# Patient Record
Sex: Female | Born: 1955
Health system: Southern US, Community
[De-identification: ages and names within clinical notes are randomized; demographics above are authoritative.]

## PROBLEM LIST (undated history)

## (undated) DIAGNOSIS — K439 Ventral hernia without obstruction or gangrene: Secondary | ICD-10-CM

## (undated) DIAGNOSIS — E079 Disorder of thyroid, unspecified: Secondary | ICD-10-CM

## (undated) DIAGNOSIS — I10 Essential (primary) hypertension: Secondary | ICD-10-CM

## (undated) DIAGNOSIS — G4730 Sleep apnea, unspecified: Secondary | ICD-10-CM

## (undated) DIAGNOSIS — Z972 Presence of dental prosthetic device (complete) (partial): Secondary | ICD-10-CM

## (undated) DIAGNOSIS — J439 Emphysema, unspecified: Secondary | ICD-10-CM

## (undated) DIAGNOSIS — M1990 Unspecified osteoarthritis, unspecified site: Secondary | ICD-10-CM

## (undated) HISTORY — PX: REPLACEMENT TOTAL KNEE: SUR1224

## (undated) HISTORY — PX: TONSILLECTOMY: SUR1361

## (undated) HISTORY — PX: ABDOMINAL HYSTERECTOMY: SHX81

---

## 2006-08-01 ENCOUNTER — Emergency Department: Payer: Self-pay

## 2006-08-02 ENCOUNTER — Other Ambulatory Visit: Payer: Self-pay

## 2007-09-30 ENCOUNTER — Emergency Department: Payer: Self-pay | Admitting: Emergency Medicine

## 2008-01-08 ENCOUNTER — Emergency Department: Payer: Self-pay | Admitting: Emergency Medicine

## 2008-06-18 ENCOUNTER — Emergency Department: Payer: Self-pay | Admitting: Emergency Medicine

## 2009-06-29 ENCOUNTER — Emergency Department: Payer: Self-pay | Admitting: Emergency Medicine

## 2012-01-29 ENCOUNTER — Emergency Department: Payer: Self-pay | Admitting: Emergency Medicine

## 2012-01-29 LAB — URINALYSIS, COMPLETE
Bacteria: NONE SEEN
Bilirubin,UR: NEGATIVE
Blood: NEGATIVE
Leukocyte Esterase: NEGATIVE
Nitrite: NEGATIVE
Ph: 6 (ref 4.5–8.0)
Protein: NEGATIVE
Squamous Epithelial: 1

## 2012-12-19 ENCOUNTER — Ambulatory Visit: Payer: Self-pay | Admitting: Physician Assistant

## 2013-04-09 ENCOUNTER — Emergency Department: Payer: Self-pay | Admitting: Emergency Medicine

## 2013-04-09 LAB — TROPONIN I

## 2013-04-09 LAB — CBC
HCT: 36.2 % (ref 35.0–47.0)
HGB: 12.2 g/dL (ref 12.0–16.0)
MCH: 29.1 pg (ref 26.0–34.0)
MCHC: 33.7 g/dL (ref 32.0–36.0)
MCV: 86 fL (ref 80–100)
Platelet: 274 10*3/uL (ref 150–440)
RBC: 4.19 10*6/uL (ref 3.80–5.20)
RDW: 14.9 % — ABNORMAL HIGH (ref 11.5–14.5)
WBC: 7.4 10*3/uL (ref 3.6–11.0)

## 2013-04-09 LAB — BASIC METABOLIC PANEL
Anion Gap: 3 — ABNORMAL LOW (ref 7–16)
BUN: 12 mg/dL (ref 7–18)
CHLORIDE: 107 mmol/L (ref 98–107)
CREATININE: 0.82 mg/dL (ref 0.60–1.30)
Calcium, Total: 8.8 mg/dL (ref 8.5–10.1)
Co2: 29 mmol/L (ref 21–32)
EGFR (Non-African Amer.): >60
GLUCOSE: 83 mg/dL (ref 65–99)
Osmolality: 276 (ref 275–301)
Potassium: 3.2 mmol/L — ABNORMAL LOW (ref 3.5–5.1)
Sodium: 139 mmol/L (ref 136–145)

## 2013-04-29 ENCOUNTER — Ambulatory Visit: Payer: Self-pay | Admitting: Cardiovascular Disease

## 2015-03-04 ENCOUNTER — Other Ambulatory Visit: Payer: Self-pay | Admitting: Physician Assistant

## 2015-03-04 DIAGNOSIS — M545 Low back pain: Secondary | ICD-10-CM

## 2015-03-16 ENCOUNTER — Ambulatory Visit
Admission: RE | Admit: 2015-03-16 | Discharge: 2015-03-16 | Disposition: A | Discharge status: Home / Self Care | Payer: Self-pay | Source: Ambulatory Visit | Attending: Physician Assistant | Admitting: Physician Assistant

## 2015-03-16 DIAGNOSIS — M545 Low back pain: Secondary | ICD-10-CM

## 2015-07-27 ENCOUNTER — Ambulatory Visit (HOSPITAL_COMMUNITY)
Payer: No Typology Code available for payment source | Attending: Obstetrics and Gynecology | Admitting: Physical Therapy

## 2015-07-27 DIAGNOSIS — M6281 Muscle weakness (generalized): Secondary | ICD-10-CM | POA: Insufficient documentation

## 2015-07-27 DIAGNOSIS — R293 Abnormal posture: Secondary | ICD-10-CM | POA: Insufficient documentation

## 2015-07-27 DIAGNOSIS — N3941 Urge incontinence: Secondary | ICD-10-CM | POA: Insufficient documentation

## 2015-07-27 NOTE — Therapy (Signed)
[facility] [address] [place], [place], [address] Phone: [phone]   Fax:  [phone]  Physical Therapy Evaluation  Patient Details  Name: Ruth SimonsLinda S Griffin MRN: [id number] Date of Birth: April 16, 1955 Referring Provider: Lanora ManisElizabeth Deans   Encounter Date: 07/27/2015      PT End of Session - 07/27/15 1204    Visit Number 1   Number of Visits 10   Date for PT Re-Evaluation 08/26/15   Authorization Type VA : therapist only    Authorization - Visit Number 1   Authorization - Number of Visits 10   PT Start Time 1120   PT Stop Time 0200   PT Time Calculation (min) 880 min   Activity Tolerance Patient tolerated treatment well      No past medical history on file.  No past surgical history on file.  There were no vitals filed for this visit.       Subjective Assessment - 07/27/15 1121    Subjective Ruth Griffin states that she has times when she is not able to hold her urine therefore she has been referred to physical therapy   Pertinent History OA, ambulates with a cane    Patient Stated Goals To be able to make it to the restroom without wetting herself    Currently in Pain? Yes   Pain Score 4    Pain Location Shoulder   Pain Orientation Left   Pain Descriptors / Indicators Aching   Pain Radiating Towards We are not seeing the patient  for this.     Pain Onset More than a month ago   Pain Frequency Intermittent   Pain Score 6   Pain Location Back   Pain Orientation Lower;Right   Pain Descriptors / Indicators Aching   Pain Type Chronic pain   Pain Radiating Towards into right hip;    Aggravating Factors  sitting long period of time and weight bearing    Pain Relieving Factors lie down             [facility] Assessment - 07/27/15 0001    Assessment   Medical Diagnosis urge incontinence    Referring Provider Pernell DupreJohn [place]    Onset Date/Surgical Date 07/26/13   Prior Therapy not for this problem     Precautions   Precautions None   Restrictions   Weight Bearing Restrictions No   Balance Screen   Has the patient fallen in the past 6 months No   Has the patient had a decrease in activity level because of a fear of falling?  No   Is the patient reluctant to leave their home because of a fear of falling?  No   Home [employer]   Living Environment Private residence   Prior Function   Level of Independence Independent   Cognition   Overall Cognitive Status Within Functional Limits for tasks assessed   ROM / Strength   AROM / PROM / Strength Strength   Strength   Strength Assessment Site Other (comment)                 Pelvic Floor Special Questions - 07/27/15 0001    Are you Pregnant or attempting pregnancy? No   Prior Pregnancies Yes   Number of Pregnancies 5   Number of Vaginal Deliveries 5   Any difficulty with labor and deliveries No   Episiotomy Performed No   Currently Sexually Active Yes   Urinary Leakage Yes   How often --  3-4 times a day    Pad use 2 per day    Activities that cause leaking With strong urge   Urinary urgency Yes   Urinary frequency 3-4 times a day    Caffeine beverages --  coffee 1 8 oz a day; tea    Incontinence Goals Patient will be independent with completion of home exercise program for pelvic floor exercise, progressing to standing for all ADL's/activities in 4 weeks.;Patient will not require more than 1 pad for protection against leaking in 12 weeks.;Patient will be independent with self-management techniques including: pelvic floor exercise, urge control techniques, behavioral modifications (reducing caffeine intake) and bladder retraining in 12 weeks;Patient will not have to urinate greater than 1x per night in 12 weeks.          [facility] Adult PT Treatment/Exercise - 07/27/15 0001    Exercises   Exercises Lumbar   Lumbar Exercises: Supine   Ab Set 10 reps   Other Supine Lumbar Exercises Kegals quick flick 2 second hold; 4  second rest x 10    Ab set while standing at wall x 10             PT Education - 07/27/15 1203    Education provided Yes   Education Details correct way to complete kegals, the importance of strong abdominal mm for pelvic health.    Person(s) Educated Patient   Methods Explanation;Handout;Verbal cues   Comprehension Verbalized understanding;Returned demonstration          PT Short Term Goals - 07/27/15 1212    PT SHORT TERM GOAL #1   Title Pt to be independent in HEP to allow leakage only 2 times a day    Time 2   Period Weeks   Status New   PT SHORT TERM GOAL #2   Title Pt to be only having to use one pad a day to prevent soiling of clothes   Time 2   Period Weeks   Status New   PT SHORT TERM GOAL #3   Title Pt to verbalize the importance of voiding on a regular basis instead of holding her urine    Time 2   Period Weeks   Status New           PT Long Term Goals - 07/27/15 1214    PT LONG TERM GOAL #1   Title Pt to state that she has not leaked in the past week    Time 5   Period Weeks   Status New   PT LONG TERM GOAL #2   Title Pt to state that she is not using any pads  to keep from soiling her clothes    Time 5   Period Weeks   Status New   PT LONG TERM GOAL #3   Title Pt to verbalize the imporance of keeping up an advance HEP in order to prevent future incontinence problems    Time 5   Period Weeks   Status New               Plan - 07/27/15 1204    Clinical Impression Statement Ruth Griffin is a 60 yo female who has noted increased incontinence in the past two years.  She denies having difficulty with lifting or sneezing but rather states that she tends to leak when she is heading to the bathroom and thinking about going to the bathroom.  Thus the patient has been diagnosed with urge vs. stress incontinence.  She has been referred to skilled physical therapy for strengthening of her abdominal and pelvic area to decrease her incontinence.      Rehab Potential Good   PT Frequency 2x / week   PT Duration --  5 weeks    PT Treatment/Interventions ADLs/Self Care Home Management;Therapeutic activities;Therapeutic exercise;Patient/family education   PT Next Visit Plan stand at wall complete abdominal sets; hands and knees abdominal set; supine abdominal set, heelslides with abdominal sets. progress to heel slide with leg 1" off the ground, sidelying abduction with abdominal set, quadriped single leg lift with abdominal contraction, opposite arm/leg raise; progress to T band exercises    PT Home Exercise Plan given    Consulted and Agree with Plan of Care Patient      Patient will benefit from skilled therapeutic intervention in order to improve the following deficits and impairments:  Decreased strength, Postural dysfunction, Obesity, Impaired tone  Visit Diagnosis: Urge incontinence - Plan: PT plan of care cert/re-cert  Abnormal posture - Plan: PT plan of care cert/re-cert  Muscle weakness (generalized) - Plan: PT plan of care cert/re-cert      G-Codes - 08/07/2015 Jun 09, 1216    Functional Assessment Tool Used clinical judgement:  number of leakages a day    Functional Limitation Other PT primary   Other PT Primary Current Status (W0981) At least 40 percent but less than 60 percent impaired, limited or restricted   Other PT Primary Goal Status (X9147) At least 1 percent but less than 20 percent impaired, limited or restricted       Problem List There are no active problems to display for this patient.   RUSSELL,CINDY 08-07-2015, 12:24 PM  [place] [facility] [address] [place], [place], [address] Phone: [phone]   Fax:  [phone]  Name: Ruth Griffin MRN: [id number] Date of Birth: Feb 20, 1955

## 2015-07-28 ENCOUNTER — Ambulatory Visit (HOSPITAL_COMMUNITY): Payer: No Typology Code available for payment source

## 2015-07-28 DIAGNOSIS — M6281 Muscle weakness (generalized): Secondary | ICD-10-CM

## 2015-07-28 DIAGNOSIS — N3941 Urge incontinence: Secondary | ICD-10-CM | POA: Diagnosis not present

## 2015-07-28 DIAGNOSIS — R293 Abnormal posture: Secondary | ICD-10-CM

## 2015-07-28 NOTE — Therapy (Signed)
[facility] [address] [place], [place], [address] Phone: [phone]   Fax:  [phone]  Physical Therapy Treatment  Patient Details  Name: Ruth SimonsLinda S Griffin MRN: [id number] Date of Birth: 02/01/1955 Referring Provider: Lanora ManisElizabeth Deans   Encounter Date: 07/28/2015      PT End of Session - 07/28/15 1828    Visit Number 2   Number of Visits 10   Date for PT Re-Evaluation 08/26/15   Authorization Type VA : therapist only    Authorization - Visit Number 2   Authorization - Number of Visits 10   PT Start Time 1730   PT Stop Time 1808   PT Time Calculation (min) 38 min   Activity Tolerance Patient tolerated treatment well   Behavior During Therapy [facility] for tasks assessed/performed      No past medical history on file.  No past surgical history on file.  There were no vitals filed for this visit.      Subjective Assessment - 07/28/15 1737    Subjective Pt reports complaince with HEP.  Reports increase LE pain with weight bearing, no pain when sitting, pain scale 3/10 Lt shoulder today.     Pertinent History OA, ambulates with a cane    Patient Stated Goals To be able to make it to the restroom without wetting herself    Currently in Pain? Yes   Pain Score 3    Pain Location Shoulder   Pain Orientation Left   Pain Descriptors / Indicators Aching   Pain Type Chronic pain   Pain Radiating Towards We are not seeing the patient for this   Pain Onset More than a month ago   Pain Frequency Intermittent   Aggravating Factors  movement   Pain Relieving Factors pain meds, heat   Effect of Pain on Daily Activities deal with it              [facility] Adult PT Treatment/Exercise - 07/28/15 0001    Lumbar Exercises: Standing   Other Standing Lumbar Exercises standing at wall with ab sets   Lumbar Exercises: Supine   Ab Set 10 reps;5 seconds  2 sets   Heel Slides 10 reps  with ab sets   Other Supine Lumbar Exercises Kegals  quick flick 2 second hold; 4 second rest x 10   Other Supine Lumbar Exercises hands on knees with ab set                PT Education - 07/27/15 1203    Education provided Yes   Education Details correct way to complete kegals, the importance of strong abdominal mm for pelvic health.    Person(s) Educated Patient   Methods Explanation;Handout;Verbal cues   Comprehension Verbalized understanding;Returned demonstration          PT Short Term Goals - 07/27/15 1212    PT SHORT TERM GOAL #1   Title Pt to be independent in HEP to allow leakage only 2 times a day    Time 2   Period Weeks   Status New   PT SHORT TERM GOAL #2   Title Pt to be only having to use one pad a day to prevent soiling of clothes   Time 2   Period Weeks   Status New   PT SHORT TERM GOAL #3   Title Pt to verbalize the importance of voiding on a regular basis instead of holding her urine    Time 2  Period Weeks   Status New           PT Long Term Goals - 07/27/15 1214    PT LONG TERM GOAL #1   Title Pt to state that she has not leaked in the past week    Time 5   Period Weeks   Status New   PT LONG TERM GOAL #2   Title Pt to state that she is not using any pads  to keep from soiling her clothes    Time 5   Period Weeks   Status New   PT LONG TERM GOAL #3   Title Pt to verbalize the imporance of keeping up an advance HEP in order to prevent future incontinence problems    Time 5   Period Weeks   Status New               Plan - 07/28/15 1828    Clinical Impression Statement Reviewed goals, compliance with HEP and copy of eval given to pt.  Pt reports she has had no leaking today.  Session focus on improving core strengthening to assist with decreased incontinence.  Pt required multimodal tactile, verbal and demonstration to improve core activation with noted abdominal fatigue with therex.     Rehab Potential Good   PT Frequency 2x / week   PT Duration --  5 weeks   PT  Treatment/Interventions ADLs/Self Care Home Management;Therapeutic activities;Therapeutic exercise;Patient/family education   PT Next Visit Plan stand at wall complete abdominal sets; hands and knees abdominal set; supine abdominal set, heelslides with abdominal sets. progress to heel slide with leg 1" off the ground, sidelying abduction with abdominal set, quadriped single leg lift with abdominal contraction, opposite arm/leg raise; progress to T band exercises       Patient will benefit from skilled therapeutic intervention in order to improve the following deficits and impairments:  Decreased strength, Postural dysfunction, Obesity, Impaired tone  Visit Diagnosis: Urge incontinence  Abnormal posture  Muscle weakness (generalized)   Problem List There are no active problems to display for this patient.  [address]Demosthenes Virnig, LPTA; CBIS [phone]   Juel BurrowCockerham, Yonathan Perrow Jo 07/28/2015, 6:32 PM  [facility] [address] , [place], [address] Phone: [phone]   Fax:  [phone]  Name: Ruth SimonsLinda S Griffin MRN: [id number] Date of Birth: [birth year]

## 2015-08-03 ENCOUNTER — Ambulatory Visit (HOSPITAL_COMMUNITY)
Payer: No Typology Code available for payment source | Attending: Obstetrics and Gynecology | Admitting: Physical Therapy

## 2015-08-03 DIAGNOSIS — M6281 Muscle weakness (generalized): Secondary | ICD-10-CM | POA: Diagnosis present

## 2015-08-03 DIAGNOSIS — R293 Abnormal posture: Secondary | ICD-10-CM | POA: Insufficient documentation

## 2015-08-03 DIAGNOSIS — N3941 Urge incontinence: Secondary | ICD-10-CM | POA: Insufficient documentation

## 2015-08-03 NOTE — Therapy (Addendum)
[facility] [address] [place], [place], [address] Phone: [phone]   Fax:  [phone]  Physical Therapy Treatment  Patient Details  Name: Ruth SimonsLinda S Griffin MRN: [id number] Date of Birth: [birth year] Referring Provider: Lanora ManisElizabeth Deans   Encounter Date: 08/03/2015      PT End of Session - 08/03/15 1519    Visit Number 3   Number of Visits 10   Date for PT Re-Evaluation 08/26/15   Authorization Type VA : therapist only    Authorization - Visit Number 3   Authorization - Number of Visits 10   PT Start Time 1440  pt arrived late   PT Stop Time 1515   PT Time Calculation (min) 35 min   Activity Tolerance Patient tolerated treatment well   Behavior During Therapy [facility] for tasks assessed/performed      No past medical history on file.  No past surgical history on file.  There were no vitals filed for this visit.      Subjective Assessment - 08/03/15 1441    Subjective Pt states she has been doing her kegels at home but is not able to do the one with her back flat agains the wall. No other complaints right now.   Pertinent History OA, ambulates with a cane    Patient Stated Goals To be able to make it to the restroom without wetting herself    Currently in Pain? No/denies   Pain Onset More than a month ago                         [facility] Adult PT Treatment/Exercise - 08/03/15 0001    Exercises   Exercises Other Exercises   Other Exercises  Seated ab set with kegel, x10 reps, feet supported   Lumbar Exercises: Supine   Ab Set 15 reps;5 seconds   Bent Knee Raise 10 reps  each LE   Bent Knee Raise Limitations with abs set and kegel   Lumbar Exercises: Sidelying   Clam 10 reps   Clam Limitations x2 sets with red TB                PT Education - 08/03/15 1517    Education provided Yes   Education Details importance of correct techinque with therex to address abdominal strength and endurance;  benefit of increased hip strength to support back during activity   Person(s) Educated Patient   Methods Explanation;Demonstration;Handout   Comprehension Verbalized understanding;Returned demonstration          PT Short Term Goals - 07/27/15 1212    PT SHORT TERM GOAL #1   Title Pt to be independent in HEP to allow leakage only 2 times a day    Time 2   Period Weeks   Status New   PT SHORT TERM GOAL #2   Title Pt to be only having to use one pad a day to prevent soiling of clothes   Time 2   Period Weeks   Status New   PT SHORT TERM GOAL #3   Title Pt to verbalize the importance of voiding on a regular basis instead of holding her urine    Time 2   Period Weeks   Status New           PT Long Term Goals - 07/27/15 1214    PT LONG TERM GOAL #1   Title Pt to state that she has not leaked in  the past week    Time 5   Period Weeks   Status New   PT LONG TERM GOAL #2   Title Pt to state that she is not using any pads  to keep from soiling her clothes    Time 5   Period Weeks   Status New   PT LONG TERM GOAL #3   Title Pt to verbalize the imporance of keeping up an advance HEP in order to prevent future incontinence problems    Time 5   Period Weeks   Status New               Plan - 08/03/15 1519    Clinical Impression Statement Today's session focused on progression of deep abdominal activation with pt able to return correct demonstration in supine position. Addition of hip strengthening activity with good tolerance to reps and resistance without report of increased pain/fatigue. Provided a few exercises to HEP in addition to those provided initially, with pt able to return correct demonstration of technique.   Rehab Potential Good   PT Frequency 2x / week   PT Duration --  5 weeks   PT Treatment/Interventions ADLs/Self Care Home Management;Therapeutic activities;Therapeutic exercise;Patient/family education   PT Next Visit Plan stand at wall complete  abdominal sets; hands and knees abdominal set; supine abdominal set, heelslides with abdominal sets. progress to heel slide with leg 1" off the ground, sidelying abduction with abdominal set, quadriped single leg lift with abdominal contraction, opposite arm/leg raise; progress to T band exercises    PT Home Exercise Plan addition of clamshells (red TB), ab set wiht bent knee raise, and bridge with ab set/kegel   Consulted and Agree with Plan of Care Patient      Patient will benefit from skilled therapeutic intervention in order to improve the following deficits and impairments:  Decreased strength, Postural dysfunction, Obesity, Impaired tone  Visit Diagnosis: Urge incontinence  Abnormal posture  Muscle weakness (generalized)     Problem List There are no active problems to display for this patient.   3:24 PM,08/03/2015 Marylyn IshiharaSara Kiser PT, [name] [facility] [phone]  [facility] [address] [place], [place], [address] Phone: [phone]   Fax:  [phone]  Name: Ruth SimonsLinda S Griffin MRN: [id number] Date of Birth: Jul 31, 1955    *Addendum made to treatment   3:39 PM,08/03/2015 Marylyn IshiharaSara Kiser PT, [name] [facility] [phone]

## 2015-08-10 ENCOUNTER — Ambulatory Visit (HOSPITAL_COMMUNITY): Payer: No Typology Code available for payment source

## 2015-08-10 DIAGNOSIS — N3941 Urge incontinence: Secondary | ICD-10-CM

## 2015-08-10 DIAGNOSIS — M6281 Muscle weakness (generalized): Secondary | ICD-10-CM

## 2015-08-10 DIAGNOSIS — R293 Abnormal posture: Secondary | ICD-10-CM

## 2015-08-10 NOTE — Therapy (Signed)
[facility] [address] [place], [place], [address] Phone: [phone]   Fax:  [phone]  Physical Therapy Treatment  Patient Details  Name: Ruth Griffin MRN: [id number] Date of Birth: 1955/11/29 Referring Provider: Lanora Manis Deans   Encounter Date: 08/10/2015      PT End of Session - 08/10/15 1127    Visit Number 4   Number of Visits 10   Date for PT Re-Evaluation 08/26/15   Authorization Type VA : therapist only    Authorization - Visit Number 4   Authorization - Number of Visits 10   PT Start Time 1124   PT Stop Time 1202   PT Time Calculation (min) 38 min   Activity Tolerance Patient tolerated treatment well   Behavior During Therapy [facility] for tasks assessed/performed      No past medical history on file.  No past surgical history on file.  There were no vitals filed for this visit.      Subjective Assessment - 08/10/15 1126    Subjective Pt stated she continues to have her episodes of incontinuous but reports reduction to 2x a day.  No reports of pain today   Patient Stated Goals To be able to make it to the restroom without wetting herself    Currently in Pain? No/denies            [facility] Adult PT Treatment/Exercise - 08/10/15 0001    Lumbar Exercises: Standing   Other Standing Lumbar Exercises standing at wall with ab sets   Lumbar Exercises: Supine   Ab Set 15 reps;5 seconds   Heel Slides 10 reps   Heel Slides Limitations witih ab sets 1in off mat   Bent Knee Raise 10 reps   Bent Knee Raise Limitations with abs set and kegel for 2 sec holds   Other Supine Lumbar Exercises Kegals quick flick 2 second hold; 4 second rest x 10   Lumbar Exercises: Sidelying   Clam 15 reps   Clam Limitations with RTB             PT Short Term Goals - 07/27/15 1212    PT SHORT TERM GOAL #1   Title Pt to be independent in HEP to allow leakage only 2 times a day    Time 2   Period Weeks   Status New   PT SHORT  TERM GOAL #2   Title Pt to be only having to use one pad a day to prevent soiling of clothes   Time 2   Period Weeks   Status New   PT SHORT TERM GOAL #3   Title Pt to verbalize the importance of voiding on a regular basis instead of holding her urine    Time 2   Period Weeks   Status New           PT Long Term Goals - 07/27/15 1214    PT LONG TERM GOAL #1   Title Pt to state that she has not leaked in the past week    Time 5   Period Weeks   Status New   PT LONG TERM GOAL #2   Title Pt to state that she is not using any pads  to keep from soiling her clothes    Time 5   Period Weeks   Status New   PT LONG TERM GOAL #3   Title Pt to verbalize the imporance of keeping up an advance HEP in order to  prevent future incontinence problems    Time 5   Period Weeks   Status New               Plan - 08/10/15 1205    Clinical Impression Statement Session focus on progessing abdominal strengthening.  Added heel slides with kegal to improve eccentric control of core musculature with minimal tactile and verbal cueing to improve contraction with movement.  Pt with good tolerance to new exercises with no reports of increased pain.  Increased cueing required wth standing core activation exercise   Rehab Potential Good   PT Frequency 2x / week   PT Duration --  5 weeks   PT Treatment/Interventions ADLs/Self Care Home Management;Therapeutic activities;Therapeutic exercise;Patient/family education   PT Next Visit Plan stand at wall complete abdominal sets; hands and knees abdominal set; supine abdominal set, heelslides with abdominal sets. progress to heel slide with leg 1" off the ground, sidelying abduction with abdominal set, quadriped single leg lift with abdominal contraction, opposite arm/leg raise; progress to T band exercises       Patient will benefit from skilled therapeutic intervention in order to improve the following deficits and impairments:  Decreased strength,  Postural dysfunction, Obesity, Impaired tone  Visit Diagnosis: Urge incontinence  Abnormal posture  Muscle weakness (generalized)     Problem List There are no active problems to display for this patient.   Juel BurrowCockerham, Raedyn Wenke Jo 08/10/2015, 12:09 PM  [facility] [address] [place], [place], [address] Phone: [phone]   Fax:  [phone]  Name: Alger SimonsLinda S Williamson MRN: [id number] Date of Birth: 02-Mar-1955

## 2015-08-12 ENCOUNTER — Ambulatory Visit (HOSPITAL_COMMUNITY): Payer: No Typology Code available for payment source | Admitting: Physical Therapy

## 2015-08-12 ENCOUNTER — Telehealth (HOSPITAL_COMMUNITY): Payer: Self-pay | Admitting: Physical Therapy

## 2015-08-12 DIAGNOSIS — M6281 Muscle weakness (generalized): Secondary | ICD-10-CM

## 2015-08-12 DIAGNOSIS — R293 Abnormal posture: Secondary | ICD-10-CM

## 2015-08-12 DIAGNOSIS — N3941 Urge incontinence: Secondary | ICD-10-CM | POA: Diagnosis not present

## 2015-08-12 NOTE — Therapy (Signed)
[facility] [address] [place], [place], [address] Phone: [phone]   Fax:  [phone]  Physical Therapy Treatment  Patient Details  Name: Ruth Griffin MRN: [id number] Date of Birth: Jun 25, 1955 Referring Provider: Lanora Manis Deans   Encounter Date: 08/12/2015      PT End of Session - 08/12/15 1836    Visit Number 5   Number of Visits 10   Date for PT Re-Evaluation 08/26/15   Authorization Type VA : therapist only    Authorization - Visit Number 5   Authorization - Number of Visits 10   PT Start Time 1732   PT Stop Time 1813   PT Time Calculation (min) 41 min   Activity Tolerance Patient tolerated treatment well   Behavior During Therapy [facility] for tasks assessed/performed      No past medical history on file.  No past surgical history on file.  There were no vitals filed for this visit.      Subjective Assessment - 08/12/15 1735    Subjective Pt reports she is doing good. She saw her referring physician and feels she is making some improvement. Not sure if she is doing her HEP correctly or not.    Patient Stated Goals To be able to make it to the restroom without wetting herself    Currently in Pain? No/denies                         [facility] Adult PT Treatment/Exercise - 08/12/15 0001    Exercises   Other Exercises  seated Kegals quick flick 2 second hold; 4 second rest x 15; seated trunk derotation and ab set with green TB and UE punches x15 each side   Lumbar Exercises: Standing   Other Standing Lumbar Exercises standing at wall with ab sets 10x5 sec holds   Lumbar Exercises: Supine   Other Supine Lumbar Exercises --   Lumbar Exercises: Sidelying   Clam 10 reps   Clam Limitations green TB x2 sets each                PT Education - 08/12/15 1834    Education provided Yes   Education Details reviewed correct technique with wall ab set; encouraged pt to try counting backwards from 100,  etc. to occupy her mind when she has urges and is walking to the bathroom   Person(s) Educated Patient   Methods Explanation;Demonstration   Comprehension Verbalized understanding;Returned demonstration          PT Short Term Goals - 07/27/15 1212    PT SHORT TERM GOAL #1   Title Pt to be independent in HEP to allow leakage only 2 times a day    Time 2   Period Weeks   Status New   PT SHORT TERM GOAL #2   Title Pt to be only having to use one pad a day to prevent soiling of clothes   Time 2   Period Weeks   Status New   PT SHORT TERM GOAL #3   Title Pt to verbalize the importance of voiding on a regular basis instead of holding her urine    Time 2   Period Weeks   Status New           PT Long Term Goals - 07/27/15 1214    PT LONG TERM GOAL #1   Title Pt to state that she has not leaked in the past week  Time 5   Period Weeks   Status New   PT LONG TERM GOAL #2   Title Pt to state that she is not using any pads  to keep from soiling her clothes    Time 5   Period Weeks   Status New   PT LONG TERM GOAL #3   Title Pt to verbalize the imporance of keeping up an advance HEP in order to prevent future incontinence problems    Time 5   Period Weeks   Status New               Plan - 08/12/15 1836    Clinical Impression Statement Today's session continued to focus on core strength with pt able to perform in upright positions with minimal cues for correct technique. Encouraged continued HEP adherence and discussed ways to address sudden urges when walking in the door of her home. Will continue with current POC.    Rehab Potential Good   PT Frequency 2x / week   PT Duration --  5 weeks   PT Treatment/Interventions ADLs/Self Care Home Management;Therapeutic activities;Therapeutic exercise;Patient/family education   PT Next Visit Plan stand at wall complete abdominal sets; hands and knees abdominal set; supine abdominal set, heelslides with abdominal sets.  progress to heel slide with leg 1" off the ground, sidelying abduction with abdominal set, quadriped single leg lift with abdominal contraction, opposite arm/leg raise; progress to T band exercises    PT Home Exercise Plan no updates   Consulted and Agree with Plan of Care Patient      Patient will benefit from skilled therapeutic intervention in order to improve the following deficits and impairments:  Decreased strength, Postural dysfunction, Obesity, Impaired tone  Visit Diagnosis: Urge incontinence  Abnormal posture  Muscle weakness (generalized)     Problem List There are no active problems to display for this patient.   6:45 PM,08/12/2015 Marylyn IshiharaSara Kiser PT, [name] [facility] [phone]  [facility] [address] [place], [place], [address] Phone: [phone]   Fax:  [phone]  Name: Ruth SimonsLinda S Griffin MRN: [id number] Date of Birth: November 12, 1955

## 2015-08-12 NOTE — Telephone Encounter (Signed)
Left message to offer pt earlier appointment today at 4pm if interested.   2:02 PM,08/12/2015 Marylyn IshiharaSara Kiser PT, [name] [facility] [phone]

## 2015-08-16 ENCOUNTER — Ambulatory Visit (HOSPITAL_COMMUNITY): Payer: No Typology Code available for payment source | Admitting: Physical Therapy

## 2015-08-16 DIAGNOSIS — N3941 Urge incontinence: Secondary | ICD-10-CM

## 2015-08-16 DIAGNOSIS — M6281 Muscle weakness (generalized): Secondary | ICD-10-CM

## 2015-08-16 DIAGNOSIS — R293 Abnormal posture: Secondary | ICD-10-CM

## 2015-08-16 NOTE — Therapy (Signed)
[facility] [address] [place], [place], [address] Phone: [phone]   Fax:  [phone]  Physical Therapy Treatment  Patient Details  Name: Ruth Griffin MRN: [id number] Date of Birth: 10-Feb-1955 Referring Provider: Lanora Manis Deans   Encounter Date: 08/16/2015      PT End of Session - 08/16/15 1151    Visit Number 6   Number of Visits 10   Date for PT Re-Evaluation 08/26/15   Authorization Type VA : therapist only    Authorization - Visit Number 6   Authorization - Number of Visits 10   PT Start Time 1120   PT Stop Time 1158   PT Time Calculation (min) 38 min   Activity Tolerance Patient tolerated treatment well   Behavior During Therapy [facility] for tasks assessed/performed      No past medical history on file.  No past surgical history on file.  There were no vitals filed for this visit.      Subjective Assessment - 08/16/15 1125    Subjective Pt states that she is not wetting herself near as much now.     Currently in Pain? No/denies                         [facility] Adult PT Treatment/Exercise - 08/16/15 0001    Lumbar Exercises: Standing   Other Standing Lumbar Exercises standing at wall with ab sets 10x5 sec holds   Lumbar Exercises: Supine   Heel Slides 10 reps bilaterally    Heel Slides Limitations heel off table    Other Supine Lumbar Exercises Kegals quick flick 2 second hold; 4 second rest x 15   Lumbar Exercises: Sidelying   Hip Abduction 10 reps bilaterally    Lumbar Exercises: Prone   Straight Leg Raise 10 reps bilaterally    Opposite Arm/Leg Raise Right arm/Left leg;Left arm/Right leg;5 reps                  PT Short Term Goals - 08/16/15 1203    PT SHORT TERM GOAL #1   Title Pt to be independent in HEP to allow leakage only 2 times a day    Time 2   Period Weeks   Status Achieved   PT SHORT TERM GOAL #2   Title Pt to be only having to use one pad a day to prevent  soiling of clothes   Time 2   Period Weeks   Status New   PT SHORT TERM GOAL #3   Title Pt to verbalize the importance of voiding on a regular basis instead of holding her urine    Time 2   Period Weeks   Status Achieved           PT Long Term Goals - 08/16/15 1203    PT LONG TERM GOAL #1   Title Pt to state that she has not leaked in the past week    Time 5   Period Weeks   Status On-going   PT LONG TERM GOAL #2   Title Pt to state that she is not using any pads  to keep from soiling her clothes    Time 5   Period Weeks   Status On-going   PT LONG TERM GOAL #3   Title Pt to verbalize the imporance of keeping up an advance HEP in order to prevent future incontinence problems    Time 5   Period Weeks  Status On-going               Plan - 08/16/15 1158    Clinical Impression Statement Pt has a significantly stronger abdominal contraction.  Added prone exercises as pt is unable to complete quadraped exercises due to rotator cuff strain in Rt shoulder and Lt wrist pain.  Progress heelslides to LE off of table.  Increased Kegal time to 11/2 minutes.   All exercises were completed with verbal and tactile cuing for proper technique    Rehab Potential Good   PT Frequency 2x / week   PT Duration --  5 weeks   PT Treatment/Interventions ADLs/Self Care Home Management;Therapeutic activities;Therapeutic exercise;Patient/family education   PT Next Visit Plan Begin t-band golfing bilaterally , pull down from door frame, and abduction with green t-band next treatment    PT Home Exercise Plan no updates   Consulted and Agree with Plan of Care Patient      Patient will benefit from skilled therapeutic intervention in order to improve the following deficits and impairments:  Decreased strength, Postural dysfunction, Obesity, Impaired tone  Visit Diagnosis: Urge incontinence  Abnormal posture  Muscle weakness (generalized)     Problem List There are no active problems  to display for this patient.   Virgina OrganCynthia Russell, PT CLT [phone] 08/16/2015, 12:04 PM  [facility] [address] [place], [place], [address] Phone: [phone]   Fax:  [phone]  Name: Ruth Griffin MRN: [id number] Date of Birth: 1955/06/07

## 2015-08-19 ENCOUNTER — Ambulatory Visit (HOSPITAL_COMMUNITY): Payer: No Typology Code available for payment source | Admitting: Physical Therapy

## 2015-08-19 DIAGNOSIS — N3941 Urge incontinence: Secondary | ICD-10-CM | POA: Diagnosis not present

## 2015-08-19 DIAGNOSIS — M6281 Muscle weakness (generalized): Secondary | ICD-10-CM

## 2015-08-19 DIAGNOSIS — R293 Abnormal posture: Secondary | ICD-10-CM

## 2015-08-19 NOTE — Therapy (Addendum)
[facility] [place], [place], [address] Phone: [phone]   Fax:  [phone]  Physical Therapy Treatment/Discharge  Patient Details  Name: Ruth Griffin MRN: [id number] Date of Birth: 08-13-1955 Referring Provider: Benjamine Mola Deans   Encounter Date: 08/19/2015      PT End of Session - 08/19/15 1345    Visit Number 7   Number of Visits 10   Date for PT Re-Evaluation 08/26/15   Authorization Type VA : therapist only    Authorization - Visit Number 7   Authorization - Number of Visits 10   PT Start Time 1941   PT Stop Time 1344   PT Time Calculation (min) 26 min   Activity Tolerance Patient tolerated treatment well   Behavior During Therapy [facility] for tasks assessed/performed      No past medical history on file.  No past surgical history on file.  There were no vitals filed for this visit.      Subjective Assessment - 08/19/15 1321    Subjective Pt states she has been doing her HEP and feels some of the exercises are making her back hurt. She has no pain currently.   Pertinent History OA, ambulates with a cane    Patient Stated Goals To be able to make it to the restroom without wetting herself    Currently in Pain? No/denies                         [facility] Adult PT Treatment/Exercise - 08/19/15 0001    Exercises   Other Exercises  seated trunk derotation with UE flexion and punches blue TB x10 each   Lumbar Exercises: Standing   Other Standing Lumbar Exercises standing T-band golf x15 each UE, each side red TB   Other Standing Lumbar Exercises standing UE pressdown with ab set 2x20 reps                 PT Education - 08/19/15 1345    Education provided Yes   Education Details updated HEP   Person(s) Educated Patient   Methods Explanation;Demonstration;Handout   Comprehension Verbalized understanding;Returned demonstration          PT Short Term Goals - 08/16/15 1203    PT SHORT TERM GOAL #1   Title Pt to be independent in HEP to allow leakage only 2 times a day    Time 2   Period Weeks   Status Achieved   PT SHORT TERM GOAL #2   Title Pt to be only having to use one pad a day to prevent soiling of clothes   Time 2   Period Weeks   Status New   PT SHORT TERM GOAL #3   Title Pt to verbalize the importance of voiding on a regular basis instead of holding her urine    Time 2   Period Weeks   Status Achieved           PT Long Term Goals - 08/16/15 1203    PT LONG TERM GOAL #1   Title Pt to state that she has not leaked in the past week    Time 5   Period Weeks   Status On-going   PT LONG TERM GOAL #2   Title Pt to state that she is not using any pads  to keep from soiling her clothes    Time 5   Period Weeks   Status On-going  PT LONG TERM GOAL #3   Title Pt to verbalize the imporance of keeping up an advance HEP in order to prevent future incontinence problems    Time 5   Period Weeks   Status On-going               Plan - 08/19/15 1346    Clinical Impression Statement Pt arrived late today. She is making progress towards goals with reported improvement in bladder issues. Session focused on progressions of stabilization of transverse abdominus in more upright positions with pt requiring verbal cues to maintain correct posturing during activity. She reported fatigue by the end of the session.   Rehab Potential Good   PT Frequency 2x / week   PT Duration --  5 weeks   PT Treatment/Interventions ADLs/Self Care Home Management;Therapeutic activities;Therapeutic exercise;Patient/family education   PT Next Visit Plan Begin t-band golfing bilaterally , pull down from door frame, and abduction with green t-band next treatment    PT Home Exercise Plan no updates   Consulted and Agree with Plan of Care Patient      Patient will benefit from skilled therapeutic intervention in order to improve the following deficits and impairments:   Decreased strength, Postural dysfunction, Obesity, Impaired tone  Visit Diagnosis: Urge incontinence  Abnormal posture  Muscle weakness (generalized)     Problem List There are no active problems to display for this patient.  1:51 PM,08/19/2015 Elly Modena PT, [name] [facility] [address] [place], [place], [address] Phone: [phone]   Fax:  [phone]  Name: Ruth Griffin MRN: [id number] Date of Birth: 1955-12-19      *Addendum to resolve episode of care and d/c pt from [facility]  Visits from Start of Care: 7  Current functional level related to goals / functional outcomes: See above for more details    Remaining deficits: See above for more details    Education / Equipment: See above for more details   Plan: Patient agrees to discharge.  Patient goals were partially met. Patient is being discharged due to not returning since the last visit.  ?????        3:35 PM,12/02/17 [place]

## 2015-08-23 ENCOUNTER — Ambulatory Visit (HOSPITAL_COMMUNITY): Payer: No Typology Code available for payment source | Admitting: Physical Therapy

## 2015-08-23 ENCOUNTER — Telehealth (HOSPITAL_COMMUNITY): Payer: Self-pay | Admitting: Physical Therapy

## 2015-08-23 NOTE — Telephone Encounter (Signed)
Arline Asp states that Sara's 1pm today cx patient can not make it. NF 08/23/15

## 2015-08-26 ENCOUNTER — Telehealth (HOSPITAL_COMMUNITY): Payer: Self-pay

## 2015-08-26 ENCOUNTER — Ambulatory Visit (HOSPITAL_COMMUNITY): Payer: No Typology Code available for payment source

## 2015-08-30 ENCOUNTER — Ambulatory Visit (HOSPITAL_COMMUNITY)
Payer: No Typology Code available for payment source | Attending: Obstetrics and Gynecology | Admitting: Physical Therapy

## 2015-08-30 NOTE — Telephone Encounter (Signed)
Called pt RE missed appointment.  No answer; left message that her next appointment is on Friday, 09/02/2015 at 11:15.  Virgina Organ, PT CLT [phone]

## 2015-09-02 ENCOUNTER — Telehealth (HOSPITAL_COMMUNITY): Payer: Self-pay | Admitting: Physical Therapy

## 2015-09-02 ENCOUNTER — Ambulatory Visit (HOSPITAL_COMMUNITY): Payer: No Typology Code available for payment source | Admitting: Physical Therapy

## 2015-09-02 NOTE — Telephone Encounter (Signed)
Her leg is hurting and she can not come in today

## 2015-09-06 ENCOUNTER — Ambulatory Visit (HOSPITAL_COMMUNITY): Payer: No Typology Code available for payment source | Admitting: Physical Therapy

## 2015-09-06 ENCOUNTER — Telehealth (HOSPITAL_COMMUNITY): Payer: Self-pay

## 2015-09-06 NOTE — Telephone Encounter (Signed)
cx - said that her mom was really sick and she would call back to reschedule

## 2015-09-09 ENCOUNTER — Encounter (HOSPITAL_COMMUNITY): Payer: No Typology Code available for payment source | Admitting: Physical Therapy

## 2016-12-03 ENCOUNTER — Other Ambulatory Visit: Payer: Self-pay | Admitting: Orthopedic Surgery

## 2016-12-03 DIAGNOSIS — M1711 Unilateral primary osteoarthritis, right knee: Secondary | ICD-10-CM

## 2017-02-13 ENCOUNTER — Other Ambulatory Visit: Payer: Self-pay

## 2017-02-13 ENCOUNTER — Encounter: Payer: Self-pay | Admitting: *Deleted

## 2017-02-13 ENCOUNTER — Ambulatory Visit
Admission: EM | Admit: 2017-02-13 | Discharge: 2017-02-13 | Disposition: A | Discharge status: Home / Self Care | Payer: Medicare Other | Attending: Family Medicine | Admitting: Family Medicine

## 2017-02-13 DIAGNOSIS — J0110 Acute frontal sinusitis, unspecified: Secondary | ICD-10-CM

## 2017-02-13 DIAGNOSIS — R05 Cough: Secondary | ICD-10-CM | POA: Diagnosis not present

## 2017-02-13 DIAGNOSIS — J0100 Acute maxillary sinusitis, unspecified: Secondary | ICD-10-CM | POA: Diagnosis not present

## 2017-02-13 DIAGNOSIS — R059 Cough, unspecified: Secondary | ICD-10-CM

## 2017-02-13 HISTORY — DX: Emphysema, unspecified: J43.9

## 2017-02-13 HISTORY — DX: Unspecified osteoarthritis, unspecified site: M19.90

## 2017-02-13 HISTORY — DX: Essential (primary) hypertension: I10

## 2017-02-13 HISTORY — DX: Disorder of thyroid, unspecified: E07.9

## 2017-02-13 MED ORDER — BENZONATATE 200 MG PO CAPS: 200.0000 mg | ORAL_CAPSULE | Freq: Three times a day (TID) | ORAL | 0 refills | Status: DC | PRN

## 2017-02-13 MED ORDER — DOXYCYCLINE HYCLATE 100 MG PO CAPS: 100.0000 mg | ORAL_CAPSULE | Freq: Two times a day (BID) | ORAL | 0 refills | Status: DC

## 2017-02-13 NOTE — ED Provider Notes (Signed)
[facility] ____________________________________________  Time seen: Approximately 1:05 PM  I have reviewed the triage vital signs and the nursing notes.   HISTORY  Chief Complaint Cough; Nasal Congestion; and Sore Throat   HPI Ruth Griffin is a 62 y.o. female presenting for evaluation of nasal drainage, postnasal drainage, sinus pressure and cough.  Patient reports symptoms have worsened over the last 4 days, but have overall been present for 2 months.  Patient reports that she does have chronic seasonal allergies,  Has continue taking her daily Claritin and Flonase.  States over the last 2 months her cough had resolved that was initially present but has continued with thick greenish nasal drainage and some sinus pressure sensation.  States for the last 4 days sinus congestion has increased and cough also increased.  It is occasionally is felt like she has had a fever, subjective not measured.  Continues with sinus pressure to her forehead and cheeks, and reports food does not taste the same.  Reports overall continues to eat and drink well.  States sore throat only with coughing, denies other sore throat.  No known sick contacts.  Has continue to remain active.  Reports symptoms unresolved with multiple over-the-counter cough and decongestants.  Denies other aggravating or alleviating factors. Denies chest pain, shortness of breath, extremity swelling or rash. Denies recent antibiotic use.   Nira ConnPancaldo, Ariana, MD: PCP   Past Medical History:  Diagnosis Date  . Arthritis   . Emphysema lung (HCC)   . Hypertension   . Thyroid disease     There are no active problems to display for this patient.   Past Surgical History:  Procedure Laterality Date  . ABDOMINAL HYSTERECTOMY    . TONSILLECTOMY       No current facility-administered medications for this encounter.   Current Outpatient Medications:  .  carvedilol (COREG) 6.25 MG tablet, Take 6.25 mg by mouth 2 (two)  times daily with a meal., Disp: , Rfl:  .  diclofenac (VOLTAREN) 50 MG EC tablet, Take 50 mg by mouth 2 (two) times daily., Disp: , Rfl:  .  hydrochlorothiazide (HYDRODIURIL) 25 MG tablet, Take 25 mg by mouth daily., Disp: , Rfl:  .  IPRATROPIUM BROMIDE IN, Inhale 17 mcg into the lungs., Disp: , Rfl:  .  levothyroxine (SYNTHROID, LEVOTHROID) 125 MCG tablet, Take 125 mcg by mouth daily before breakfast., Disp: , Rfl:  .  losartan (COZAAR) 50 MG tablet, Take 50 mg by mouth 2 (two) times daily., Disp: , Rfl:  .  potassium chloride (KLOR-CON) 20 MEQ packet, Take 20 mEq by mouth daily., Disp: , Rfl:  .  simvastatin (ZOCOR) 40 MG tablet, Take 40 mg by mouth daily., Disp: , Rfl:  .  benzonatate (TESSALON) 200 MG capsule, Take 1 capsule (200 mg total) by mouth 3 (three) times daily as needed for cough., Disp: 20 capsule, Rfl: 0 .  doxycycline (VIBRAMYCIN) 100 MG capsule, Take 1 capsule (100 mg total) by mouth 2 (two) times daily., Disp: 20 capsule, Rfl: 0  Allergies Keflex [cephalexin]; Penicillins; and Codeine  Family History  Problem Relation Age of Onset  . Stroke Mother     Social History Social History   Tobacco Use  . Smoking status: Former Games developermoker  . Smokeless tobacco: Never Used  Substance Use Topics  . Alcohol use: No    Frequency: Never  . Drug use: No    Review of Systems Constitutional: No fever/chills ENT: As above.  Cardiovascular: Denies chest pain. Respiratory:  Denies shortness of breath. Gastrointestinal: No abdominal pain.  No nausea, no vomiting.  No diarrhea.  Genitourinary: Negative for dysuria. Musculoskeletal: Negative for back pain. Skin: Negative for rash.   ____________________________________________   PHYSICAL EXAM:  VITAL SIGNS: ED Triage Vitals  Enc Vitals Group     BP 02/13/17 1211 136/80     Pulse Rate 02/13/17 1211 62     Resp 02/13/17 1211 16     Temp 02/13/17 1211 98.1 F (36.7 C)     Temp Source 02/13/17 1211 Oral     SpO2 02/13/17  1211 100 %     Weight 02/13/17 1215 249 lb (112.9 kg)     Height 02/13/17 1215 5\' 5"  (1.651 m)     Head Circumference --      Peak Flow --      Pain Score 02/13/17 1215 0     Pain Loc --      Pain Edu? --      Excl. in GC? --    Constitutional: Alert and oriented. Well appearing and in no acute distress. Eyes: Conjunctivae are normal. PERRL. EOMI. Head: Atraumatic.Mild tenderness to palpation bilateral frontal and maxillary sinuses. No swelling. No erythema.   Ears: no erythema, normal TMs bilaterally.   Nose: nasal congestion with bilateral nasal turbinate erythema and edema.   Mouth/Throat: Mucous membranes are moist.  Oropharynx non-erythematous.No tonsillar swelling or exudate.  Neck: No stridor.  No cervical spine tenderness to palpation. Hematological/Lymphatic/Immunilogical: No cervical lymphadenopathy. Cardiovascular: Normal rate, regular rhythm. Grossly normal heart sounds.  Good peripheral circulation. Respiratory: Normal respiratory effort.  No retractions.  No wheezes, rales or rhonchi. Good air movement.  Musculoskeletal: No cervical, thoracic or lumbar tenderness to palpation.  Neurologic:  Normal speech and language. No gait instability. Skin:  Skin is warm, dry and intact. No rash noted. Psychiatric: Mood and affect are normal. Speech and behavior are normal.  ___________________________________________   LABS (all labs ordered are listed, but only abnormal results are displayed)  Labs Reviewed - No data to display ____________________________________________  PROCEDURES Procedures     INITIAL IMPRESSION / ASSESSMENT AND PLAN / ED COURSE  Pertinent labs & imaging results that were available during my care of the patient were reviewed by me and considered in my medical decision making (see chart for details).  Well-appearing patient.  No acute distress.  Suspect recent viral upper respiratory infection, concern for recurrent lasting sinusitis.  Will treat  patient with oral doxycycline and PRN Tessalon Perles.  Encourage rest, fluids, supportive care, continue home Flonase and antihistamine.Discussed indication, risks and benefits of medications with patient.  Discussed follow up with Primary care physician this week. Discussed follow up and return parameters including no resolution or any worsening concerns. Patient verbalized understanding and agreed to plan.   ____________________________________________   FINAL CLINICAL IMPRESSION(S) / ED DIAGNOSES  Final diagnoses:  Acute frontal sinusitis, recurrence not specified  Acute maxillary sinusitis, recurrence not specified  Cough     ED Discharge Orders        Ordered    doxycycline (VIBRAMYCIN) 100 MG capsule  2 times daily     02/13/17 1245    benzonatate (TESSALON) 200 MG capsule  3 times daily PRN     02/13/17 1245       Note: This dictation was prepared with Dragon dictation along with smaller phrase technology. Any transcriptional errors that result from this process are unintentional.         Renford Dills, NP  02/13/17 1318  

## 2017-02-13 NOTE — ED Triage Notes (Signed)
Patient started having symptoms of cough nasal congestion, sore throat and chest congestion 4 days ago. Patient reports having similar symptoms 2 months ago that did not completely resolve.

## 2017-02-13 NOTE — Discharge Instructions (Signed)
Take medication as prescribed. Rest. Drink plenty of fluids.  ° °Follow up with your primary care physician this week as needed. Return to Urgent care for new or worsening concerns.  ° °

## 2017-02-16 ENCOUNTER — Telehealth: Payer: Self-pay

## 2017-02-16 NOTE — Telephone Encounter (Signed)
Called to follow up with patient since visit here at [facility]. Spoke with pt. Patient reports improvement. Patient instructed to call back with any questions or concerns. MAH  

## 2017-11-18 DIAGNOSIS — Z1159 Encounter for screening for other viral diseases: Secondary | ICD-10-CM | POA: Diagnosis not present

## 2017-11-18 DIAGNOSIS — M25512 Pain in left shoulder: Secondary | ICD-10-CM | POA: Diagnosis not present

## 2017-11-18 DIAGNOSIS — E785 Hyperlipidemia, unspecified: Secondary | ICD-10-CM | POA: Diagnosis not present

## 2017-11-18 DIAGNOSIS — I10 Essential (primary) hypertension: Secondary | ICD-10-CM | POA: Diagnosis not present

## 2017-11-18 DIAGNOSIS — E039 Hypothyroidism, unspecified: Secondary | ICD-10-CM | POA: Diagnosis not present

## 2017-12-23 DIAGNOSIS — I10 Essential (primary) hypertension: Secondary | ICD-10-CM | POA: Diagnosis not present

## 2017-12-23 DIAGNOSIS — G4733 Obstructive sleep apnea (adult) (pediatric): Secondary | ICD-10-CM | POA: Diagnosis not present

## 2017-12-25 DIAGNOSIS — M19012 Primary osteoarthritis, left shoulder: Secondary | ICD-10-CM | POA: Diagnosis not present

## 2018-02-04 ENCOUNTER — Ambulatory Visit: Payer: Self-pay | Admitting: Orthopedic Surgery

## 2018-03-05 ENCOUNTER — Inpatient Hospital Stay: Admission: RE | Admit: 2018-03-05 | Payer: Medicare Other | Source: Ambulatory Visit

## 2018-03-19 ENCOUNTER — Encounter: Admission: RE | Payer: Self-pay | Source: Home / Self Care

## 2018-03-19 ENCOUNTER — Inpatient Hospital Stay: Admission: RE | Admit: 2018-03-19 | Payer: Medicare Other | Source: Home / Self Care | Admitting: Orthopedic Surgery

## 2018-03-19 SURGERY — ARTHROPLASTY, KNEE, TOTAL
Anesthesia: Choice | Laterality: Right

## 2018-11-03 ENCOUNTER — Other Ambulatory Visit: Payer: Self-pay

## 2018-11-03 DIAGNOSIS — Z20822 Contact with and (suspected) exposure to covid-19: Secondary | ICD-10-CM

## 2018-11-04 LAB — NOVEL CORONAVIRUS, NAA: SARS-CoV-2, NAA: NOT DETECTED

## 2019-03-30 DIAGNOSIS — U071 COVID-19: Secondary | ICD-10-CM

## 2019-03-30 HISTORY — DX: COVID-19: U07.1

## 2019-04-23 ENCOUNTER — Ambulatory Visit: Payer: Medicare Other | Attending: Internal Medicine

## 2019-04-23 DIAGNOSIS — Z20822 Contact with and (suspected) exposure to covid-19: Secondary | ICD-10-CM

## 2019-04-24 LAB — SARS-COV-2, NAA 2 DAY TAT

## 2019-04-24 LAB — NOVEL CORONAVIRUS, NAA: SARS-CoV-2, NAA: NOT DETECTED

## 2019-04-29 ENCOUNTER — Ambulatory Visit
Admission: EM | Admit: 2019-04-29 | Discharge: 2019-04-29 | Disposition: A | Discharge status: Home / Self Care | Payer: Medicare Other | Attending: Emergency Medicine | Admitting: Emergency Medicine

## 2019-04-29 ENCOUNTER — Ambulatory Visit: Payer: Medicare Other

## 2019-04-29 ENCOUNTER — Other Ambulatory Visit: Payer: Self-pay

## 2019-04-29 DIAGNOSIS — R059 Cough, unspecified: Secondary | ICD-10-CM

## 2019-04-29 DIAGNOSIS — U071 COVID-19: Secondary | ICD-10-CM

## 2019-04-29 DIAGNOSIS — R05 Cough: Secondary | ICD-10-CM | POA: Diagnosis not present

## 2019-04-29 MED ORDER — BENZONATATE 200 MG PO CAPS: 200.0000 mg | ORAL_CAPSULE | Freq: Three times a day (TID) | ORAL | 0 refills | Status: DC | PRN

## 2019-04-29 NOTE — ED Provider Notes (Signed)
HPI  SUBJECTIVE:  Ruth Griffin is a 64 y.o. female who presents with a cough that has been present for the past 6 days since being diagnosed with Covid on 3/25.  She states that the mucus was initially green, but it is clearing.  She states that the coughing is worse at night.  She reports postnasal drip, rhinorrhea that is the same color as the material that she is coughing up, wheezing.  States that she has been monitoring her oxygen at home and it has consistently been above 94%.  She was called by her PMD today and sent here for evaluation.  States that she has had difficulty sleeping at night secondary to the cough.  No fevers nasal congestion sinus pain or pressure shortness of breath chest pain.  States that she takes Tylenol fairly regularly, but has not taken any today.  She has been taking Tylenol and Mucinex with dextromethorphan, Flonase and occasionally using her albuterol inhaler as needed with improvement in her symptoms.  Symptoms are worse with lying down at night, talking.  She has a past medical history of emphysema, she is a former smoker, hypertension, allergies.  Not sure if her allergies are bothering her currently.  She has an albuterol rescue inhaler and has only used this several times.  She has not needed it regularly since being diagnosed with Covid.  No history of diabetes coronary disease cancer chronic kidney disease immunocompromise HIV.  PMD: Dr. Nadara Mustard at the [facility].  [facility] test positive for Covid on 3/25  Past Medical History:  Diagnosis Date  . Arthritis   . Emphysema lung ([place])   . Hypertension   . Thyroid disease     Past Surgical History:  Procedure Laterality Date  . ABDOMINAL HYSTERECTOMY    . TONSILLECTOMY      Family History  Problem Relation Age of Onset  . Stroke Mother     Social History   Tobacco Use  . Smoking status: Former Research scientist (life sciences)  . Smokeless tobacco: Never Used  Substance Use Topics  . Alcohol use: No  . Drug use: No    No current  facility-administered medications for this encounter.  Current Outpatient Medications:  .  aspirin 81 MG chewable tablet, Chew 81 mg by mouth daily., Disp: , Rfl:  .  Calcium Carbonate-Vitamin D 600-400 MG-UNIT tablet, Take 1 tablet by mouth daily., Disp: , Rfl:  .  carvedilol (COREG) 12.5 MG tablet, Take 12.5 mg by mouth 2 (two) times daily with a meal., Disp: , Rfl:  .  diclofenac (VOLTAREN) 50 MG EC tablet, Take 50 mg by mouth 2 (two) times daily., Disp: , Rfl:  .  hydrochlorothiazide (HYDRODIURIL) 25 MG tablet, Take 25 mg by mouth daily., Disp: , Rfl:  .  levothyroxine (SYNTHROID, LEVOTHROID) 125 MCG tablet, Take 125 mcg by mouth daily before breakfast., Disp: , Rfl:  .  losartan (COZAAR) 50 MG tablet, Take 50 mg by mouth 2 (two) times daily., Disp: , Rfl:  .  potassium chloride (KLOR-CON) 20 MEQ packet, Take 20 mEq by mouth daily., Disp: , Rfl:  .  simvastatin (ZOCOR) 40 MG tablet, Take 40 mg by mouth daily., Disp: , Rfl:  .  benzonatate (TESSALON) 200 MG capsule, Take 1 capsule (200 mg total) by mouth 3 (three) times daily as needed for cough., Disp: 30 capsule, Rfl: 0 .  IPRATROPIUM BROMIDE IN, Inhale 17 mcg into the lungs., Disp: , Rfl:  .  potassium chloride SA (KLOR-CON) 20 MEQ tablet, Take  1 tablet by mouth daily., Disp: , Rfl:   Allergies  Allergen Reactions  . Keflex [Cephalexin] Anaphylaxis  . Penicillins Hives  . Codeine Other (See Comments)    Altered mental status.  . Nsaids Itching    Other reaction(s): ITCHING     ROS  As noted in HPI.   Physical Exam  BP (!) 128/91 (BP Location: Left Arm)   Pulse 74   Temp 98.3 F (36.8 C) (Oral)   Resp 18   Ht 5\' 4"  (1.626 m)   Wt 122.5 kg   SpO2 100%   BMI 46.35 kg/m   Constitutional: Well developed, well nourished, no acute distress Eyes: PERRL, EOMI, conjunctiva normal bilaterally HENT: Normocephalic, atraumatic,mucus membranes moist.  Erythematous, swollen turbinates.  Clear nasal congestion.  No maxillary  frontal sinus tenderness.  Positive cobblestoning.  No obvious postnasal drip. Respiratory: Clear to auscultation bilaterally, no rales, no wheezing, no rhonchi.  Positive mild anterior chest wall tenderness Cardiovascular: Normal rate and rhythm, no murmurs, no gallops, no rubs GI: Nondistended Back:  skin: No rash, skin intact Musculoskeletal: No deformity Neurologic: Alert & oriented x 3, CN III-XII grossly intact, no motor deficits, sensation grossly intact Psychiatric: Speech and behavior appropriate   ED Course   Medications - No data to display  Orders Placed This Encounter  Procedures  . DG Chest 2 View    Standing Status:   Standing    Number of Occurrences:   1    Order Specific Question:   Reason for Exam (SYMPTOM  OR DIAGNOSIS REQUIRED)    Answer:   covid + cough r/o PNA   No results found for this or any previous visit (from the past 24 hour(s)). No results found.  ED Clinical Impression  1. COVID-19 virus infection   2. Cough      ED Assessment/Plan  Care everywhere labs reviewed.  Confirmed positive Covid test on 3/25  Suspect cough is coming from post nasal drip, could also be aggravated with Covid.  Her sinuses are nontender, doubt sinusitis.  Her vitals are normal, satting 100% on room air.  She denies fevers, has not taken an antipyretic today, her lungs are clear so even though she is at high risk for severe disease with the emphysema do not think that imaging is necessary today as I do not think that she has a bacterial infection superimposed on this Covid infection.  Doubt emphysema exacerbation as she denies shortness of breath. will send home with Tessalon.  She is to continue Flonase, start saline nasal irrigation,  albuterol as needed but advised her to use a spacer with it.  Mucinex DM as needed.  Follow-up with PMD as needed.  To the ER if she gets worse.  Discussed  MDM, treatment plan, and plan for follow-up with patient.  Gave her sn/sx that  should prompt return to the ED. patient agrees with plan.   Meds ordered this encounter  Medications  . benzonatate (TESSALON) 200 MG capsule    Sig: Take 1 capsule (200 mg total) by mouth 3 (three) times daily as needed for cough.    Dispense:  30 capsule    Refill:  0    *This clinic note was created using 4/25. Therefore, there may be occasional mistakes despite careful proofreading.  ?    Scientist, clinical (histocompatibility and immunogenetics), MD 04/29/19 1235

## 2019-04-29 NOTE — ED Triage Notes (Signed)
Pt presents with c/o recent COVID POSITIVE (04/23/19) and states she has continued productive cough. Pt reports mucus has become more clear, occasionally green. Pt also reports wheezing. Pt denies shob or other symptoms.

## 2019-04-29 NOTE — Discharge Instructions (Addendum)
I suspect that most of your cough is coming from postnasal drip although I think the Covid infection is not helping.  Tessalon will help with the cough.  You can continue Mucinex DM.  Continue Flonase, start saline nasal irrigation with a Lloyd Huger med rinse and distilled water as often as you want, use your albuterol inhaler with a spacer as needed.  Follow-up with your doctor if not getting any better in 10 days, go immediately to the ER for chest pain or pressure, shortness of breath especially if it does not respond to the albuterol, or for any concerns.

## 2019-09-14 ENCOUNTER — Ambulatory Visit: Payer: Self-pay

## 2019-09-29 ENCOUNTER — Ambulatory Visit: Payer: No Typology Code available for payment source

## 2019-10-08 ENCOUNTER — Ambulatory Visit (INDEPENDENT_AMBULATORY_CARE_PROVIDER_SITE_OTHER): Payer: No Typology Code available for payment source

## 2019-10-08 ENCOUNTER — Ambulatory Visit
Admission: EM | Admit: 2019-10-08 | Discharge: 2019-10-08 | Disposition: A | Discharge status: Home / Self Care | Payer: No Typology Code available for payment source | Attending: Internal Medicine | Admitting: Internal Medicine

## 2019-10-08 ENCOUNTER — Other Ambulatory Visit: Payer: Self-pay

## 2019-10-08 ENCOUNTER — Encounter: Payer: Self-pay | Admitting: Emergency Medicine

## 2019-10-08 DIAGNOSIS — E079 Disorder of thyroid, unspecified: Secondary | ICD-10-CM | POA: Insufficient documentation

## 2019-10-08 DIAGNOSIS — Z7982 Long term (current) use of aspirin: Secondary | ICD-10-CM | POA: Diagnosis not present

## 2019-10-08 DIAGNOSIS — J209 Acute bronchitis, unspecified: Secondary | ICD-10-CM | POA: Diagnosis not present

## 2019-10-08 DIAGNOSIS — M1990 Unspecified osteoarthritis, unspecified site: Secondary | ICD-10-CM | POA: Insufficient documentation

## 2019-10-08 DIAGNOSIS — R519 Headache, unspecified: Secondary | ICD-10-CM

## 2019-10-08 DIAGNOSIS — Z87891 Personal history of nicotine dependence: Secondary | ICD-10-CM | POA: Diagnosis not present

## 2019-10-08 DIAGNOSIS — R05 Cough: Secondary | ICD-10-CM | POA: Diagnosis present

## 2019-10-08 DIAGNOSIS — R0982 Postnasal drip: Secondary | ICD-10-CM

## 2019-10-08 DIAGNOSIS — R0602 Shortness of breath: Secondary | ICD-10-CM | POA: Insufficient documentation

## 2019-10-08 DIAGNOSIS — Z7901 Long term (current) use of anticoagulants: Secondary | ICD-10-CM | POA: Diagnosis not present

## 2019-10-08 DIAGNOSIS — R053 Chronic cough: Secondary | ICD-10-CM

## 2019-10-08 DIAGNOSIS — J438 Other emphysema: Secondary | ICD-10-CM | POA: Diagnosis not present

## 2019-10-08 DIAGNOSIS — Z79899 Other long term (current) drug therapy: Secondary | ICD-10-CM | POA: Insufficient documentation

## 2019-10-08 DIAGNOSIS — I10 Essential (primary) hypertension: Secondary | ICD-10-CM | POA: Insufficient documentation

## 2019-10-08 DIAGNOSIS — Z20822 Contact with and (suspected) exposure to covid-19: Secondary | ICD-10-CM | POA: Insufficient documentation

## 2019-10-08 MED ORDER — AZITHROMYCIN 250 MG PO TABS: 250.0000 mg | ORAL_TABLET | Freq: Every day | ORAL | 0 refills | Status: DC

## 2019-10-08 NOTE — Discharge Instructions (Signed)
Your x-ray was normal today.  There does not appear to be any sign of pneumonia.  Since you have had persistent symptoms since having Covid and you already have known emphysema/lung disease, you should follow-up with a pulmonologist.  Make an appoint with your PCP to request pulmonology referral.  You may be having mild COPD exacerbation so I have sent azithromycin for you at this time.  You should be seen again if you have any chest pain, fever, shortness of breath, weakness or any new or worsening symptoms.  Otherwise follow-up with PCP and ultimately pulmonology.

## 2019-10-08 NOTE — ED Provider Notes (Signed)
[facility]    CSN: [id number] Arrival date & time: 10/08/19  1349      History   Chief Complaint Chief Complaint  Patient presents with  . Cough  . Wheezing    HPI Ruth Griffin is a 64 y.o. female.   64 year old female presents for chronic cough that has been worsening over the past couple days.  She has a history of emphysema.  She says she had COVID-19 infection at the end of March 2021 and has had persistent productive cough since.  She says she was talking about this with [facility] and they advised her to go to urgent care to get a prescription for an antibiotic.  Patient denies any fever, fatigue, weakness.  She denies any chest pain.  She says she does have wheezing and shortness of breath at times.  She has 2 different inhalers that she has been using.  She denies taking any cough medication.  Patient says she had a chest x-ray when she had Covid but has not had a chest x-ray in the past 6 months.  She says that she does not have a pulmonologist and follows up with her primary care provider for treatment of her emphysema.  Patient denies any recent Covid exposure.  She has not been vaccinated for Covid.  She denies any other symptoms or concerns today.     Past Medical History:  Diagnosis Date  . Arthritis   . Emphysema lung ([facility])   . Hypertension   . Thyroid disease     There are no problems to display for this patient.   Past Surgical History:  Procedure Laterality Date  . ABDOMINAL HYSTERECTOMY    . TONSILLECTOMY      OB History   No obstetric history on file.      Home Medications    Prior to Admission medications   Medication Sig Start Date End Date Taking? Authorizing Provider  aspirin 81 MG chewable tablet Chew 81 mg by mouth daily.   Yes [provider]  Calcium Carbonate-Vitamin D 600-400 MG-UNIT tablet Take 1 tablet by mouth daily.   Yes [provider]  carvedilol (COREG) 12.5 MG tablet Take 12.5 mg by mouth  2 (two) times daily with a meal.   Yes [provider]  diclofenac (VOLTAREN) 50 MG EC tablet Take 50 mg by mouth 2 (two) times daily.   Yes [provider]  hydrochlorothiazide (HYDRODIURIL) 25 MG tablet Take 25 mg by mouth daily.   Yes [provider]  IPRATROPIUM BROMIDE IN Inhale 17 mcg into the lungs.   Yes [provider]  levothyroxine (SYNTHROID, LEVOTHROID) 125 MCG tablet Take 125 mcg by mouth daily before breakfast.   Yes [provider]  losartan (COZAAR) 50 MG tablet Take 50 mg by mouth 2 (two) times daily.   Yes [provider]  simvastatin (ZOCOR) 40 MG tablet Take 40 mg by mouth daily.   Yes [provider]  azithromycin (ZITHROMAX) 250 MG tablet Take 1 tablet (250 mg total) by mouth daily. Take first 2 tablets together, then 1 every day until finished. 10/08/19   Shirlee Latch, PA-C  benzonatate (TESSALON) 200 MG capsule Take 1 capsule (200 mg total) by mouth 3 (three) times daily as needed for cough. 04/29/19   Domenick Gong, MD  potassium chloride (KLOR-CON) 20 MEQ packet Take 20 mEq by mouth daily.    [provider]  potassium chloride SA (KLOR-CON) 20 MEQ tablet Take  1 tablet by mouth daily.    [provider]    Family History Family History  Problem Relation Age of Onset  . Stroke Mother     Social History Social History   Tobacco Use  . Smoking status: Former Games developer  . Smokeless tobacco: Never Used  Vaping Use  . Vaping Use: Never used  Substance Use Topics  . Alcohol use: No  . Drug use: No     Allergies   Keflex [cephalexin], Penicillins, Codeine, and Nsaids   Review of Systems Review of Systems  Constitutional: Negative for chills, diaphoresis, fatigue and fever.  HENT: Negative for congestion, ear pain, rhinorrhea, sinus pressure, sinus pain and sore throat.   Respiratory: Positive for cough, shortness of breath and wheezing. Negative for chest tightness.    Cardiovascular: Negative for chest pain.  Gastrointestinal: Negative for abdominal pain, nausea and vomiting.  Musculoskeletal: Negative for arthralgias and myalgias.  Skin: Negative for rash.  Neurological: Negative for weakness and headaches.  Hematological: Negative for adenopathy.     Physical Exam Triage Vital Signs ED Triage Vitals  Enc Vitals Group     BP 10/08/19 1518 131/78     Pulse Rate 10/08/19 1518 63     Resp 10/08/19 1518 20     Temp 10/08/19 1518 98.1 F (36.7 C)     Temp Source 10/08/19 1518 Oral     SpO2 10/08/19 1518 99 %     Weight 10/08/19 1514 270 lb 1 oz (122.5 kg)     Height 10/08/19 1514 5\' 4"  (1.626 m)     Head Circumference --      Peak Flow --      Pain Score 10/08/19 1514 2     Pain Loc --      Pain Edu? --      Excl. in GC? --    No data found.  Updated Vital Signs BP 131/78 (BP Location: Right Arm)   Pulse 63   Temp 98.1 F (36.7 C) (Oral)   Resp 20   Ht 5\' 4"  (1.626 m)   Wt 270 lb 1 oz (122.5 kg)   SpO2 99%   BMI 46.36 kg/m       Physical Exam Vitals and nursing note reviewed.  Constitutional:      General: She is not in acute distress.    Appearance: Normal appearance. She is not ill-appearing or toxic-appearing.  HENT:     Head: Normocephalic and atraumatic.     Nose: Nose normal.     Mouth/Throat:     Mouth: Mucous membranes are moist.     Pharynx: Oropharynx is clear.  Eyes:     General: No scleral icterus.       Right eye: No discharge.        Left eye: No discharge.     Conjunctiva/sclera: Conjunctivae normal.  Cardiovascular:     Rate and Rhythm: Normal rate and regular rhythm.     Heart sounds: Normal heart sounds.  Pulmonary:     Effort: Pulmonary effort is normal. No respiratory distress.     Breath sounds: Wheezing (few scattered wheezes throughout) present.  Musculoskeletal:     Cervical back: Neck supple.     Right lower leg: No edema.     Left lower leg: No edema.  Skin:    General: Skin is dry.   Neurological:     General: No focal deficit present.     Mental Status: She is alert. Mental  status is at baseline.     Motor: No weakness.     Gait: Gait normal.  Psychiatric:        Mood and Affect: Mood normal.        Behavior: Behavior normal.        Thought Content: Thought content normal.      UC Treatments / Results  Labs (all labs ordered are listed, but only abnormal results are displayed) Labs Reviewed  SARS CORONAVIRUS 2 (TAT 6-24 HRS)    EKG   Radiology DG Chest 2 View  Result Date: 10/08/2019 CLINICAL DATA:  Cough, postnasal drip, headache EXAM: CHEST - 2 VIEW COMPARISON:  04/09/2013 FINDINGS: Frontal and lateral views of the chest demonstrate an unremarkable cardiac silhouette. No airspace disease, effusion, or pneumothorax. No acute bony abnormalities. IMPRESSION: 1. No acute intrathoracic process. Electronically Signed   By: Sharlet Salina M.D.   On: 10/08/2019 15:48    Procedures Procedures (including critical care time)  Medications Ordered in UC Medications - No data to display  Initial Impression / Assessment and Plan / UC Course  I have reviewed the triage vital signs and the nursing notes.  Pertinent labs & imaging results that were available during my care of the patient were reviewed by me and considered in my medical decision making (see chart for details).   64 year old female presenting for chronic cough worsening over the past couple of days.  Chest x-ray performed which was within normal limits.  She does have a history of emphysema/COPD.  Since cough has been ongoing for months post Covid advised her to follow-up with pulmonologist for more advanced testing.  At this time treating for acute bronchitis with azithromycin.  She says she wants to avoid any prednisone at this time.  Advised her to continue using inhalers.  Covid test performed and she knows to isolate for 10 days if positive.  CDC guidelines, isolation protocol in ED precautions  discussed for Covid.  Advised patient follow-up with [place] or emergency department for any worsening of breathing, new fever or worsening cough.  Final Clinical Impressions(s) / UC Diagnoses   Final diagnoses:  Acute bronchitis, unspecified organism  Chronic cough  Other emphysema ([facility])     Discharge Instructions     Your x-ray was normal today.  There does not appear to be any sign of pneumonia.  Since you have had persistent symptoms since having Covid and you already have known emphysema/lung disease, you should follow-up with a pulmonologist.  Make an appoint with your PCP to request pulmonology referral.  You may be having mild COPD exacerbation so I have sent azithromycin for you at this time.  You should be seen again if you have any chest pain, fever, shortness of breath, weakness or any new or worsening symptoms.  Otherwise follow-up with PCP and ultimately pulmonology.    ED Prescriptions    Medication Sig Dispense Auth. Provider   azithromycin (ZITHROMAX) 250 MG tablet Take 1 tablet (250 mg total) by mouth daily. Take first 2 tablets together, then 1 every day until finished. 6 tablet Gareth Morgan     PDMP not reviewed this encounter.   Shirlee Latch, PA-C 10/09/19 1228

## 2019-10-08 NOTE — ED Triage Notes (Signed)
Pt c/o cough, post nasal drip and headache. She states she has had this since she had covid back in March. She has been using OTC medication and allergy medications but Is not getting better.

## 2019-10-09 LAB — SARS CORONAVIRUS 2 (TAT 6-24 HRS): SARS Coronavirus 2: NEGATIVE

## 2020-02-22 ENCOUNTER — Ambulatory Visit (INDEPENDENT_AMBULATORY_CARE_PROVIDER_SITE_OTHER): Payer: No Typology Code available for payment source

## 2020-02-22 ENCOUNTER — Other Ambulatory Visit: Payer: Self-pay

## 2020-02-22 ENCOUNTER — Encounter: Payer: Self-pay | Admitting: Emergency Medicine

## 2020-02-22 ENCOUNTER — Ambulatory Visit
Admission: EM | Admit: 2020-02-22 | Discharge: 2020-02-22 | Disposition: A | Discharge status: Home / Self Care | Payer: No Typology Code available for payment source | Attending: Emergency Medicine | Admitting: Emergency Medicine

## 2020-02-22 DIAGNOSIS — R509 Fever, unspecified: Secondary | ICD-10-CM | POA: Diagnosis not present

## 2020-02-22 DIAGNOSIS — R059 Cough, unspecified: Secondary | ICD-10-CM

## 2020-02-22 DIAGNOSIS — J189 Pneumonia, unspecified organism: Secondary | ICD-10-CM

## 2020-02-22 DIAGNOSIS — U071 COVID-19: Secondary | ICD-10-CM | POA: Diagnosis not present

## 2020-02-22 DIAGNOSIS — R0602 Shortness of breath: Secondary | ICD-10-CM

## 2020-02-22 MED ORDER — ALBUTEROL SULFATE HFA 108 (90 BASE) MCG/ACT IN AERS: 2.0000 | INHALATION_SPRAY | RESPIRATORY_TRACT | 0 refills | Status: DC | PRN

## 2020-02-22 MED ORDER — DOXYCYCLINE HYCLATE 100 MG PO CAPS: 100.0000 mg | ORAL_CAPSULE | Freq: Two times a day (BID) | ORAL | 0 refills | Status: DC

## 2020-02-22 MED ORDER — AZITHROMYCIN 250 MG PO TABS: 250.0000 mg | ORAL_TABLET | Freq: Every day | ORAL | 0 refills | Status: DC

## 2020-02-22 MED ORDER — AEROCHAMBER MV MISC: 2 refills | Status: AC

## 2020-02-22 MED ORDER — BENZONATATE 100 MG PO CAPS: 200.0000 mg | ORAL_CAPSULE | Freq: Three times a day (TID) | ORAL | 0 refills | Status: DC

## 2020-02-22 NOTE — Discharge Instructions (Signed)
Take the doxycycline twice daily for 10 days for treatment of the pneumonia.  Take the azithromycin 2 tablets on day 1 and 1 tablet on day 2 through 5 for total of 5 days of treatment.  Use the Tessalon Perles every 8 hours as needed for cough.  Take them with a small sip of water.  Use the albuterol inhaler with the spacer, 2 puffs every 4-6 hours, as needed for wheezing.  Return for reevaluation or follow-up with your primary care provider if your symptoms not improved.

## 2020-02-22 NOTE — ED Provider Notes (Addendum)
[facility]    CSN: [id number] Arrival date & time: 02/22/20  1338      History   Chief Complaint Chief Complaint  Patient presents with  . Cough    HPI Ruth Griffin is a 65 y.o. female.   HPI   65 year old female here for evaluation of continued cough and fever 10 days status post Covid.  Patient states that she tested positive for Covid on February 12, 2020, completed quarantine, but she is concerned because she is continuing to have productive cough for a white sputum, wheezing, and fever up to 99.8.  Patient denies any shortness of breath.  Patient can speak in full sentences and is not in any acute distress.  Past Medical History:  Diagnosis Date  . Arthritis   . COVID-19 03/2019  . Emphysema lung (HCC)   . Hypertension   . Thyroid disease     There are no problems to display for this patient.   Past Surgical History:  Procedure Laterality Date  . ABDOMINAL HYSTERECTOMY    . TONSILLECTOMY      OB History   No obstetric history on file.      Home Medications    Prior to Admission medications   Medication Sig Start Date End Date Taking? Authorizing Provider  albuterol (VENTOLIN HFA) 108 (90 Base) MCG/ACT inhaler Inhale 2 puffs into the lungs every 4 (four) hours as needed. 02/22/20  Yes Becky Augusta, NP  azithromycin (ZITHROMAX Z-PAK) 250 MG tablet Take 1 tablet (250 mg total) by mouth daily. Take 2 tablets on day 1 and 1 tablet each day after that for total of 5 days of treatment. 02/22/20  Yes Becky Augusta, NP  benzonatate (TESSALON) 100 MG capsule Take 2 capsules (200 mg total) by mouth every 8 (eight) hours. 02/22/20  Yes Becky Augusta, NP  doxycycline (VIBRAMYCIN) 100 MG capsule Take 1 capsule (100 mg total) by mouth 2 (two) times daily. 02/22/20  Yes Becky Augusta, NP  Spacer/Aero-Holding Chambers (AEROCHAMBER MV) inhaler Use as instructed 02/22/20  Yes Becky Augusta, NP  aspirin 81 MG chewable tablet Chew 81 mg by mouth daily.    [provider]  Calcium Carbonate-Vitamin D 600-400 MG-UNIT tablet Take 1 tablet by mouth daily.    [provider]  carvedilol (COREG) 12.5 MG tablet Take 12.5 mg by mouth 2 (two) times daily with a meal.    [provider]  diclofenac (VOLTAREN) 50 MG EC tablet Take 50 mg by mouth 2 (two) times daily.    [provider]  hydrochlorothiazide (HYDRODIURIL) 25 MG tablet Take 25 mg by mouth daily.    [provider]  IPRATROPIUM BROMIDE IN Inhale 17 mcg into the lungs.    [provider]  levothyroxine (SYNTHROID, LEVOTHROID) 125 MCG tablet Take 125 mcg by mouth daily before breakfast.    [provider]  losartan (COZAAR) 50 MG tablet Take 50 mg by mouth 2 (two) times daily.    [provider]  potassium chloride (KLOR-CON) 20 MEQ packet Take 20 mEq by mouth daily.    [provider]  potassium chloride SA (KLOR-CON) 20 MEQ tablet Take 1 tablet by mouth daily.    [provider]  simvastatin (ZOCOR) 40 MG tablet Take 40 mg by mouth daily.    [provider]    Family History Family History  Problem Relation Age of Onset  . Stroke Mother     Social History Social History   Tobacco Use  .  Smoking status: Former Games developer  . Smokeless tobacco: Never Used  Vaping Use  . Vaping Use: Never used  Substance Use Topics  . Alcohol use: No  . Drug use: No     Allergies   Keflex [cephalexin], Penicillins, Codeine, and Nsaids   Review of Systems Review of Systems  Constitutional: Positive for fever. Negative for activity change and appetite change.  Respiratory: Positive for cough and wheezing. Negative for shortness of breath.   Musculoskeletal: Negative for arthralgias and myalgias.  Skin: Negative for rash.  Hematological: Negative.   Psychiatric/Behavioral: Negative.      Physical Exam Triage Vital Signs ED Triage Vitals  Enc Vitals Group     BP 02/22/20 1459 104/79     Pulse Rate  02/22/20 1459 76     Resp 02/22/20 1459 18     Temp 02/22/20 1459 98.9 F (37.2 C)     Temp Source 02/22/20 1459 Oral     SpO2 02/22/20 1459 100 %     Weight 02/22/20 1500 251 lb (113.9 kg)     Height 02/22/20 1500 5\' 4"  (1.626 m)     Head Circumference --      Peak Flow --      Pain Score 02/22/20 1500 6     Pain Loc --      Pain Edu? --      Excl. in [place]? --    No data found.  Updated Vital Signs BP 104/79 (BP Location: Left Arm)   Pulse 76   Temp 98.9 F (37.2 C) (Oral)   Resp 18   Ht 5\' 4"  (1.626 m)   Wt 251 lb (113.9 kg)   SpO2 100%   BMI 43.08 kg/m   Visual Acuity Right Eye Distance:   Left Eye Distance:   Bilateral Distance:    Right Eye Near:   Left Eye Near:    Bilateral Near:     Physical Exam Vitals and nursing note reviewed.  Constitutional:      General: She is not in acute distress.    Appearance: Normal appearance. She is obese. She is not toxic-appearing.  HENT:     Head: Normocephalic and atraumatic.  Cardiovascular:     Rate and Rhythm: Normal rate and regular rhythm.     Pulses: Normal pulses.     Heart sounds: Normal heart sounds. No murmur heard. No gallop.   Pulmonary:     Effort: Pulmonary effort is normal.     Breath sounds: Rales present. No wheezing or rhonchi.     Comments: Patient has crackles in her left lower lobe. Skin:    General: Skin is warm and dry.     Capillary Refill: Capillary refill takes less than 2 seconds.     Findings: No erythema or rash.  Neurological:     General: No focal deficit present.     Mental Status: She is alert and oriented to person, place, and time.  Psychiatric:        Mood and Affect: Mood normal.        Behavior: Behavior normal.        Thought Content: Thought content normal.        Judgment: Judgment normal.      UC Treatments / Results  Labs (all labs ordered are listed, but only abnormal results are displayed) Labs Reviewed - No data to display  EKG   Radiology DG Chest 2  View  Result Date: 02/22/2020 CLINICAL DATA:  Ten  days following coronavirus infection. Shortness of breath, productive cough and fever. EXAM: CHEST - 2 VIEW COMPARISON:  10/08/2019 FINDINGS: Heart size is normal. Mediastinal shadows are normal. Scattered areas of hazy/patchy pulmonary density, most notable in the right upper lung and left lateral mid lung. No dense consolidation or lobar collapse. No effusion. Follow-up to clearing suggested in a patient of this age. No significant bone finding. IMPRESSION: Scattered areas of hazy/patchy pulmonary density most consistent with pneumonia, most notable in the right upper lung and left lateral mid lung. No dense consolidation or lobar collapse. Follow-up to clearing suggested. Electronically Signed   By: Paulina FusiMark  Shogry M.D.   On: 02/22/2020 16:13    Procedures Procedures (including critical care time)  Medications Ordered in UC Medications - No data to display  Initial Impression / Assessment and Plan / UC Course  I have reviewed the triage vital signs and the nursing notes.  Pertinent labs & imaging results that were available during my care of the patient were reviewed by me and considered in my medical decision making (see chart for details).   Patient is here for evaluation of productive cough and continued fever 10 days status post Covid diagnosis.  Patient can speak in full sentences and she is not in any acute distress.  Patient reports that her sputum is a thick white and this is a change from the clear sputum she has been producing.Marland Kitchen.  Physical exam reveals crackles in her left lower lobe posteriorly with clear lung sounds in the remainder of her lung fields.  Will obtain chest x-ray to look for pneumonia.  Radiology interpretation of chest x-ray is for multilobar pneumonia.  Prominently in the right upper lobe and left lateral mid lobe.  We will double cover with doxycycline and azithromycin, give Tessalon Perles for cough, and albuterol  inhaler with spacer for wheezing.   Final Clinical Impressions(s) / UC Diagnoses   Final diagnoses:  None     Discharge Instructions     Take the doxycycline twice daily for 10 days for treatment of the pneumonia.  Take the azithromycin 2 tablets on day 1 and 1 tablet on day 2 through 5 for total of 5 days of treatment.  Use the Tessalon Perles every 8 hours as needed for cough.  Take them with a small sip of water.  Use the albuterol inhaler with the spacer, 2 puffs every 4-6 hours, as needed for wheezing.  Return for reevaluation or follow-up with your primary care provider if your symptoms not improved.    ED Prescriptions    Medication Sig Dispense Auth. Provider   albuterol (VENTOLIN HFA) 108 (90 Base) MCG/ACT inhaler Inhale 2 puffs into the lungs every 4 (four) hours as needed. 18 g Becky Augustayan, Keiondra Brookover, NP   Spacer/Aero-Holding Chambers (AEROCHAMBER MV) inhaler Use as instructed 1 each Becky Augustayan, Kaidin Boehle, NP   benzonatate (TESSALON) 100 MG capsule Take 2 capsules (200 mg total) by mouth every 8 (eight) hours. 21 capsule Becky Augustayan, Jersey Ravenscroft, NP   doxycycline (VIBRAMYCIN) 100 MG capsule Take 1 capsule (100 mg total) by mouth 2 (two) times daily. 20 capsule Becky Augustayan, Ladell Bey, NP   azithromycin (ZITHROMAX Z-PAK) 250 MG tablet Take 1 tablet (250 mg total) by mouth daily. Take 2 tablets on day 1 and 1 tablet each day after that for total of 5 days of treatment. 6 tablet Becky Augustayan, Cristy Colmenares, NP     PDMP not reviewed this encounter.   Becky Augustayan, Dianah Pruett, NP 02/22/20 1623    Becky Augustayan, Shomari Scicchitano,  NP 02/28/20 0750

## 2020-02-22 NOTE — ED Triage Notes (Signed)
Pt test positive for covid on 02/12/20. She c/o of continued cough and low grade fever treated with Tylenol at home.

## 2021-08-02 ENCOUNTER — Other Ambulatory Visit: Payer: Self-pay | Admitting: Orthopedic Surgery

## 2021-08-02 ENCOUNTER — Ambulatory Visit
Admission: RE | Admit: 2021-08-02 | Discharge: 2021-08-02 | Disposition: A | Discharge status: Home / Self Care | Payer: Medicare Other | Source: Ambulatory Visit | Attending: Orthopedic Surgery | Admitting: Orthopedic Surgery

## 2021-08-02 DIAGNOSIS — R2242 Localized swelling, mass and lump, left lower limb: Secondary | ICD-10-CM | POA: Insufficient documentation

## 2021-09-01 IMAGING — CR DG CHEST 2V
2 series · 2 of 2 positions shown · non-contrast
Comparison: 10/08/2019

CLINICAL DATA: Ten days following coronavirus infection. Shortness
of breath, productive cough and fever.

EXAM:
CHEST - 2 VIEW

[chest pa]
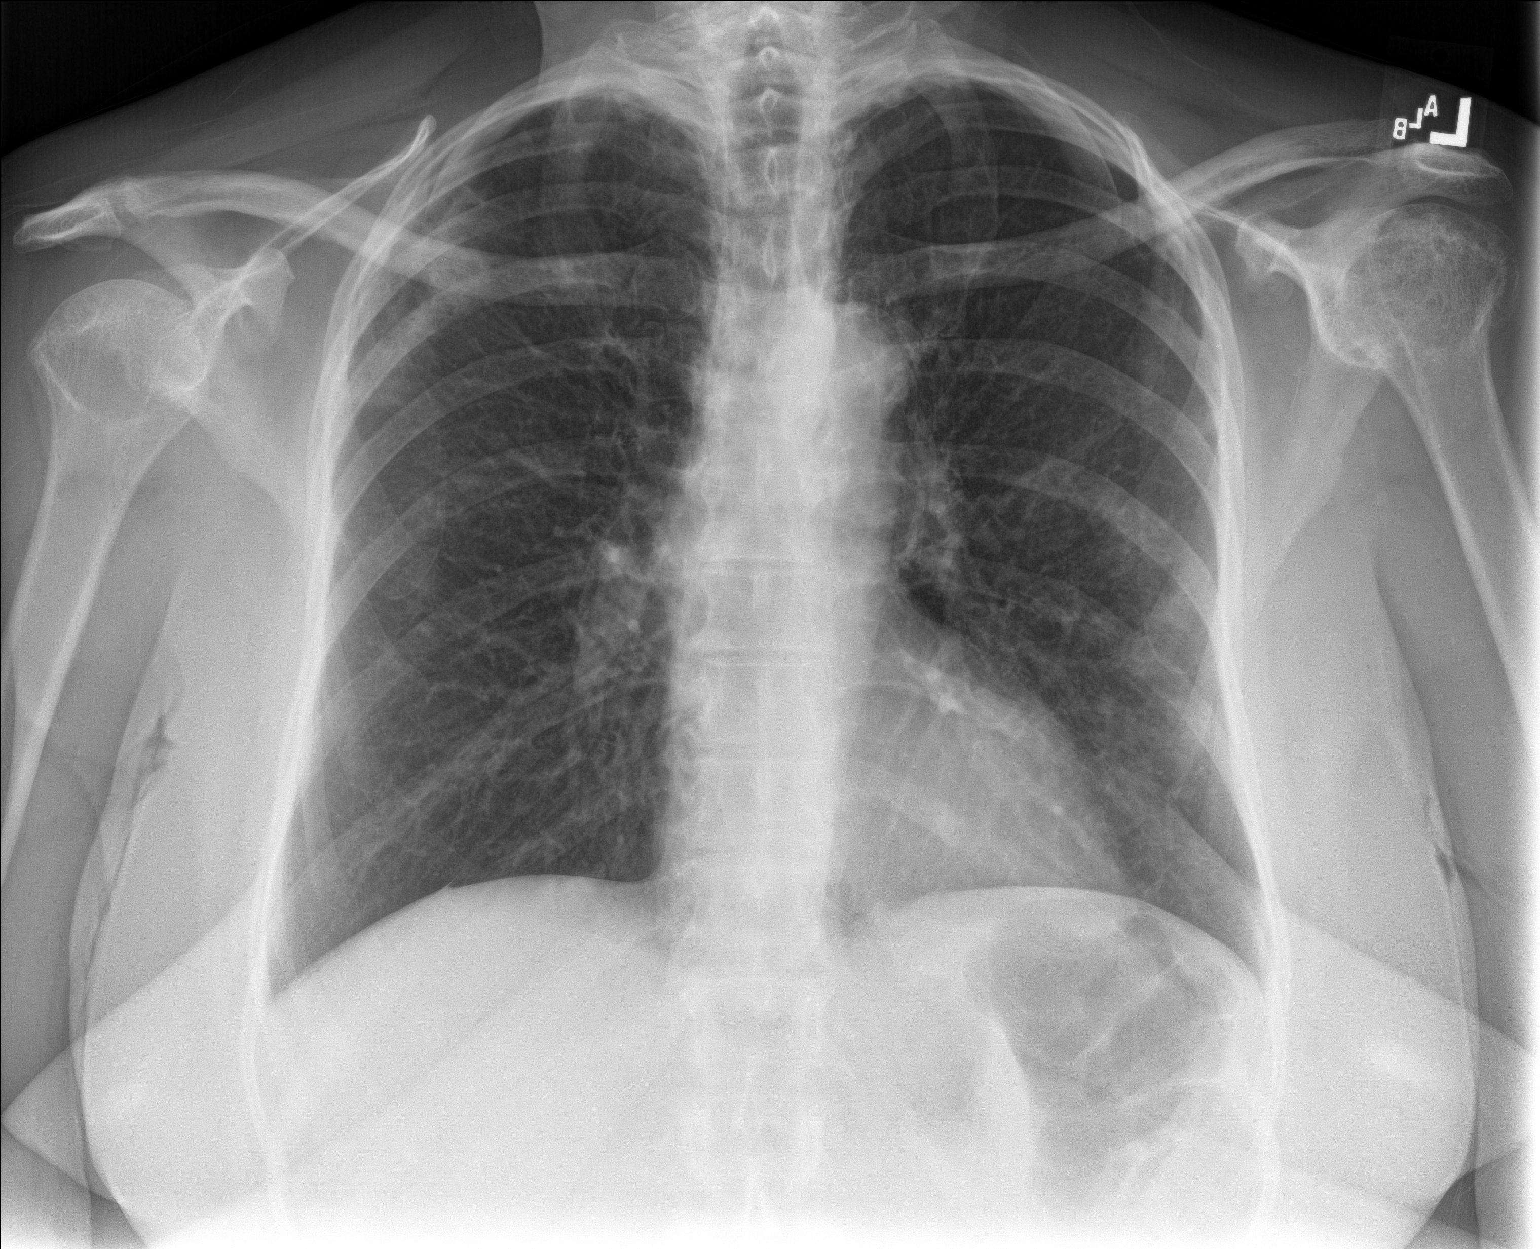

[chest lat]
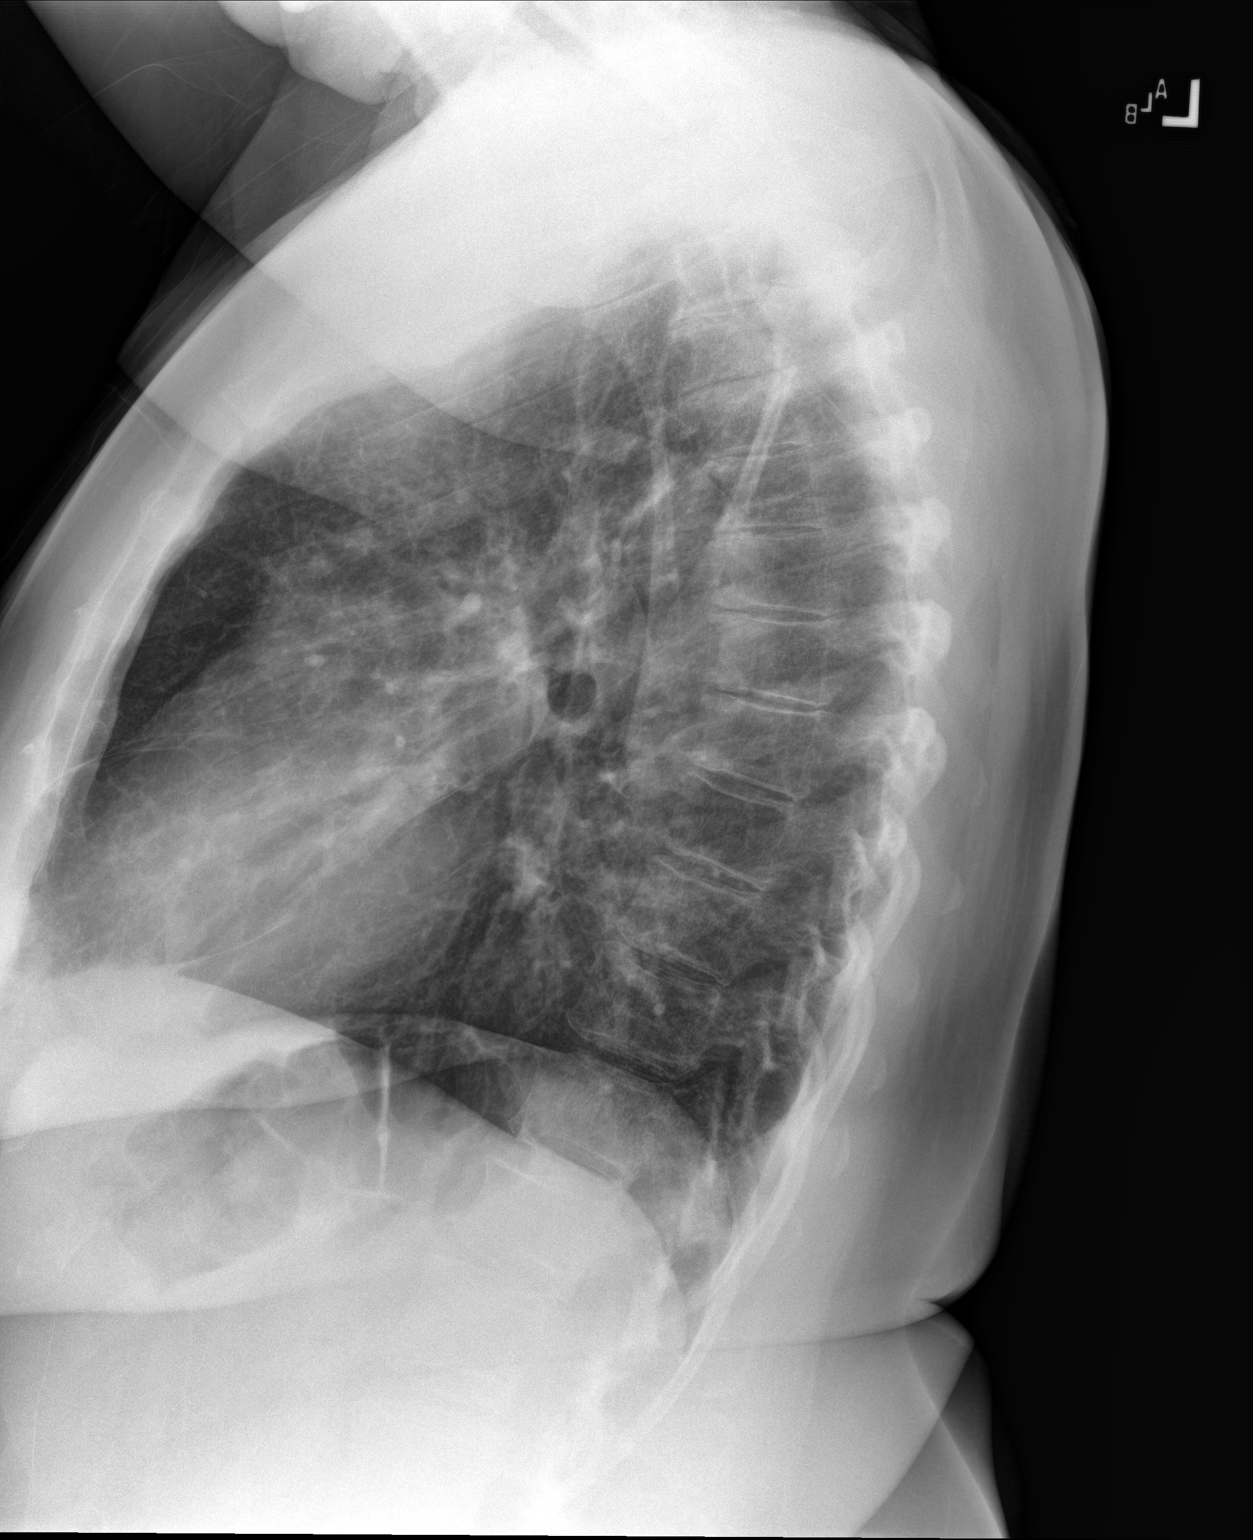

[2 of 2 positions shown; findings below may reference images not displayed]

FINDINGS: Heart size is normal. Mediastinal shadows are normal. Scattered
areas of hazy/patchy pulmonary density, most notable in the right
upper lung and left lateral mid lung. No dense consolidation or
lobar collapse. No effusion. Follow-up to clearing suggested in a
patient of this age. No significant bone finding.
IMPRESSION: Scattered areas of hazy/patchy pulmonary density most consistent
with pneumonia, most notable in the right upper lung and left
lateral mid lung. No dense consolidation or lobar collapse.
Follow-up to clearing suggested.

## 2022-02-17 ENCOUNTER — Ambulatory Visit
Admission: RE | Admit: 2022-02-17 | Discharge: 2022-02-17 | Disposition: A | Discharge status: Home / Self Care | Payer: No Typology Code available for payment source | Source: Ambulatory Visit | Attending: Emergency Medicine | Admitting: Emergency Medicine

## 2022-02-17 VITALS — BP 127/88 | HR 79 | Temp 97.9°F | Ht 64.0 in | Wt 230.0 lb

## 2022-02-17 DIAGNOSIS — J069 Acute upper respiratory infection, unspecified: Secondary | ICD-10-CM

## 2022-02-17 MED ORDER — PROMETHAZINE-DM 6.25-15 MG/5ML PO SYRP: 2.5000 mL | ORAL_SOLUTION | Freq: Every evening | ORAL | 0 refills | Status: DC | PRN

## 2022-02-17 MED ORDER — BENZONATATE 100 MG PO CAPS: 100.0000 mg | ORAL_CAPSULE | Freq: Three times a day (TID) | ORAL | 0 refills | Status: DC

## 2022-02-17 MED ORDER — AZITHROMYCIN 250 MG PO TABS: 250.0000 mg | ORAL_TABLET | Freq: Every day | ORAL | 0 refills | Status: DC

## 2022-02-17 MED ORDER — PREDNISONE 20 MG PO TABS: 40.0000 mg | ORAL_TABLET | Freq: Every day | ORAL | 0 refills | Status: DC

## 2022-02-17 MED ORDER — ALBUTEROL SULFATE HFA 108 (90 BASE) MCG/ACT IN AERS: 2.0000 | INHALATION_SPRAY | RESPIRATORY_TRACT | 0 refills | Status: AC | PRN

## 2022-02-17 NOTE — ED Triage Notes (Signed)
Pt c/o congestion, nasal drainage x54month  Pt states that she has some SOB but uses her inhaler.  Pt states that she takes loratadine and guaifenesin and it is not helping.

## 2022-02-17 NOTE — ED Provider Notes (Signed)
[facility]    CSN: [id number] Arrival date & time: 02/17/22  1229      History   Chief Complaint Chief Complaint  Patient presents with   Nasal Congestion   Shortness of Breath    HPI Ruth Griffin is a 67 y.o. female.   Patient presents for evaluation of a persisting productive cough, nasal congestion, rhinorrhea for 1 month.  Initially had fever within the first week which has resolved.  Cough is worse at nighttime and when lying flat.  Endorses green sputum.  Shortness of breath is experienced with exertion, improves with rest.  Associated fatigue and malaise.  Tolerating food and liquids.  Has attempted use of an antihistamine and daily inhalers which has been somewhat helpful.  History of COPD and a tonsillectomy.      Past Medical History:  Diagnosis Date   Arthritis    COVID-19 03/2019   Emphysema lung ([place])    Hypertension    Thyroid disease     There are no problems to display for this patient.   Past Surgical History:  Procedure Laterality Date   ABDOMINAL HYSTERECTOMY     TONSILLECTOMY      OB History   No obstetric history on file.      Home Medications    Prior to Admission medications   Medication Sig Start Date End Date Taking? Authorizing Provider  aspirin 81 MG chewable tablet Chew 81 mg by mouth daily.   Yes [provider]  carvedilol (COREG) 12.5 MG tablet Take 12.5 mg by mouth 2 (two) times daily with a meal.   Yes [provider]  diclofenac (VOLTAREN) 50 MG EC tablet Take 50 mg by mouth 2 (two) times daily.   Yes [provider]  hydrochlorothiazide (HYDRODIURIL) 25 MG tablet Take 25 mg by mouth daily.   Yes [provider]  IPRATROPIUM BROMIDE IN Inhale 17 mcg into the lungs.   Yes [provider]  levothyroxine (SYNTHROID, LEVOTHROID) 125 MCG tablet Take 125 mcg by mouth daily before breakfast.   Yes [provider]  losartan (COZAAR) 50 MG tablet Take 50 mg by mouth  2 (two) times daily.   Yes [provider]  potassium chloride (KLOR-CON) 20 MEQ packet Take 20 mEq by mouth daily.   Yes [provider]  potassium chloride SA (KLOR-CON) 20 MEQ tablet Take 1 tablet by mouth daily.   Yes [provider]  simvastatin (ZOCOR) 40 MG tablet Take 40 mg by mouth daily.   Yes [provider]  albuterol (VENTOLIN HFA) 108 (90 Base) MCG/ACT inhaler Inhale 2 puffs into the lungs every 4 (four) hours as needed. 02/22/20   Margarette Canada, NP  azithromycin (ZITHROMAX Z-PAK) 250 MG tablet Take 1 tablet (250 mg total) by mouth daily. Take 2 tablets on day 1 and 1 tablet each day after that for total of 5 days of treatment. 02/22/20   Margarette Canada, NP  benzonatate (TESSALON) 100 MG capsule Take 2 capsules (200 mg total) by mouth every 8 (eight) hours. 02/22/20   Margarette Canada, NP  Calcium Carbonate-Vitamin D 600-400 MG-UNIT tablet Take 1 tablet by mouth daily.    [provider]  doxycycline (VIBRAMYCIN) 100 MG capsule Take 1 capsule (100 mg total) by mouth 2 (two) times daily. 02/22/20   Margarette Canada, NP  Spacer/Aero-Holding Josiah Lobo (AEROCHAMBER MV) inhaler Use as instructed 02/22/20   Margarette Canada, NP    Family History Family History  Problem Relation Age of  Onset   Stroke Mother     Social History Social History   Tobacco Use   Smoking status: Former   Smokeless tobacco: Never  Scientific laboratory technician Use: Never used  Substance Use Topics   Alcohol use: No   Drug use: No     Allergies   Keflex [cephalexin], Penicillins, Codeine, and Nsaids   Review of Systems Review of Systems  Constitutional:  Positive for fever. Negative for activity change, appetite change, chills, diaphoresis, fatigue and unexpected weight change.  HENT:  Positive for congestion and rhinorrhea. Negative for dental problem, drooling, ear discharge, ear pain, facial swelling, hearing loss, mouth sores, nosebleeds, postnasal drip, sinus pressure, sinus  pain, sneezing, sore throat, tinnitus, trouble swallowing and voice change.   Respiratory:  Positive for cough, shortness of breath and wheezing. Negative for apnea, choking, chest tightness and stridor.      Physical Exam Triage Vital Signs ED Triage Vitals  Enc Vitals Group     BP 02/17/22 1241 127/88     Pulse Rate 02/17/22 1241 79     Resp --      Temp 02/17/22 1241 97.9 F (36.6 C)     Temp Source 02/17/22 1241 Oral     SpO2 02/17/22 1241 96 %     Weight 02/17/22 1239 230 lb (104.3 kg)     Height 02/17/22 1239 5\' 4"  (1.626 m)     Head Circumference --      Peak Flow --      Pain Score 02/17/22 1239 0     Pain Loc --      Pain Edu? --      Excl. in [place]? --    No data found.  Updated Vital Signs BP 127/88 (BP Location: Left Arm)   Pulse 79   Temp 97.9 F (36.6 C) (Oral)   Ht 5\' 4"  (1.626 m)   Wt 230 lb (104.3 kg)   SpO2 96%   BMI 39.48 kg/m   Visual Acuity Right Eye Distance:   Left Eye Distance:   Bilateral Distance:    Right Eye Near:   Left Eye Near:    Bilateral Near:     Physical Exam Constitutional:      Appearance: Normal appearance.  HENT:     Head: Normocephalic.     Right Ear: Tympanic membrane, ear canal and external ear normal.     Left Ear: Tympanic membrane, ear canal and external ear normal.     Nose: Congestion and rhinorrhea present.     Mouth/Throat:     Mouth: Mucous membranes are moist.     Pharynx: Posterior oropharyngeal erythema present.  Cardiovascular:     Rate and Rhythm: Normal rate and regular rhythm.     Pulses: Normal pulses.     Heart sounds: Normal heart sounds.  Pulmonary:     Effort: Pulmonary effort is normal.     Breath sounds: Normal breath sounds.  Skin:    General: Skin is warm and dry.  Neurological:     Mental Status: She is alert and oriented to person, place, and time. Mental status is at baseline.      UC Treatments / Results  Labs (all labs ordered are listed, but only abnormal results are  displayed) Labs Reviewed - No data to display  EKG   Radiology No results found.  Procedures Procedures (including critical care time)  Medications Ordered in UC Medications - No data to display  Initial Impression /  Assessment and Plan / UC Course  I have reviewed the triage vital signs and the nursing notes.  Pertinent labs & imaging results that were available during my care of the patient were reviewed by me and considered in my medical decision making (see chart for details).  Acute upper respiratory infection  Viral stable, patient is in no signs of distress nontoxic-appearing, lungs are clear to auscultation and O2 saturation 96% on room air therefore imaging deferred most likely COPD clear but initially began as a viral etiology, discussed with patient, Z-Pak, prednisone, Tessalon and Promethazine DM prescribed, using Atrovent inhaler as maintenance, refill rescue inhaler, advise follow-up with urgent care if symptoms persist or worsen Final Clinical Impressions(s) / UC Diagnoses   Final diagnoses:  None   Discharge Instructions   None    ED Prescriptions   None    PDMP not reviewed this encounter.   Valinda Hoar, NP 02/17/22 1327

## 2022-02-17 NOTE — Discharge Instructions (Signed)
Today you are being treated for inflammation to your upper airways most likely caused by your recent infection  Begin azithromycin as directed to provide coverage for bacteria which is most likely prolonging your symptoms  Begin use of prednisone every morning with food for 5 days to help reduce inflammation   May use albuterol inhaler taking 2 puffs every 4 hours as needed to help calm shortness of breath and wheezing  May use Tessalon pill throughout the day taking every 8 hours as needed  May use promethazine DM at times for coughing and additional comfort, be mindful this medication may make you drowsy  For worsening signs of breathing please go to the nearest emergency department for evaluation  In addition:  Maintaining adequate hydration may help to thin secretions and soothe the respiratory mucosa   Warm Liquids- Ingestion of warm liquids may have a soothing effect on the respiratory mucosa, increase the flow of nasal mucus, and loosen respiratory secretions, making them easier to remove  May try honey (2.5 to 5 mL [0.5 to 1 teaspoon]) can be given straight or diluted in liquid (juice). Corn syrup may be substituted if honey is not available.

## 2022-02-27 ENCOUNTER — Ambulatory Visit (INDEPENDENT_AMBULATORY_CARE_PROVIDER_SITE_OTHER): Payer: No Typology Code available for payment source

## 2022-02-27 ENCOUNTER — Ambulatory Visit
Admission: RE | Admit: 2022-02-27 | Discharge: 2022-02-27 | Disposition: A | Discharge status: Home / Self Care | Payer: No Typology Code available for payment source | Source: Ambulatory Visit | Attending: Physician Assistant | Admitting: Physician Assistant

## 2022-02-27 VITALS — BP 154/93 | HR 64 | Temp 98.0°F | Resp 18 | Ht 64.0 in | Wt 229.9 lb

## 2022-02-27 DIAGNOSIS — J441 Chronic obstructive pulmonary disease with (acute) exacerbation: Secondary | ICD-10-CM

## 2022-02-27 DIAGNOSIS — R051 Acute cough: Secondary | ICD-10-CM | POA: Diagnosis not present

## 2022-02-27 DIAGNOSIS — J019 Acute sinusitis, unspecified: Secondary | ICD-10-CM | POA: Diagnosis not present

## 2022-02-27 MED ORDER — PREDNISONE 10 MG PO TABS: ORAL_TABLET | ORAL | 0 refills | Status: DC

## 2022-02-27 MED ORDER — IPRATROPIUM BROMIDE 0.06 % NA SOLN: 2.0000 | Freq: Four times a day (QID) | NASAL | 0 refills | Status: DC

## 2022-02-27 MED ORDER — PSEUDOEPH-BROMPHEN-DM 30-2-10 MG/5ML PO SYRP: 10.0000 mL | ORAL_SOLUTION | Freq: Four times a day (QID) | ORAL | 0 refills | Status: AC | PRN

## 2022-02-27 MED ORDER — DOXYCYCLINE HYCLATE 100 MG PO CAPS: 100.0000 mg | ORAL_CAPSULE | Freq: Two times a day (BID) | ORAL | 0 refills | Status: AC

## 2022-02-27 NOTE — ED Provider Notes (Signed)
[facility]    CSN: [id number] Arrival date & time: 02/27/22  2355      History   Chief Complaint Chief Complaint  Patient presents with   Cough    HPI Ruth Griffin is a 67 y.o. female returning for 1 month history of nasal congestion and productive cough.  Right-sided nasal pressure and postnasal drip.  The nasal drainage is yellowish discoloration and so is the sputum from the cough.  Medical history significant for emphysema.  She denies feeling any more short of breath than normal.  Patient was seen here 6 days ago and treated with azithromycin, promethazine DM, benzonatate, and prednisone.  Patient says she does like the medication helped and she is feeling little worse than she was a week ago.  Denies fever or any known exposure to COVID or flu.  HPI  Past Medical History:  Diagnosis Date   Arthritis    COVID-19 03/2019   Emphysema lung (HCC)    Hypertension    Thyroid disease     There are no problems to display for this patient.   Past Surgical History:  Procedure Laterality Date   ABDOMINAL HYSTERECTOMY     TONSILLECTOMY      OB History   No obstetric history on file.      Home Medications    Prior to Admission medications   Medication Sig Start Date End Date Taking? Authorizing Provider  albuterol (VENTOLIN HFA) 108 (90 Base) MCG/ACT inhaler Inhale 2 puffs into the lungs every 4 (four) hours as needed. 02/17/22  Yes Valinda Hoar, NP  aspirin 81 MG chewable tablet Chew 81 mg by mouth daily.   Yes [provider]  brompheniramine-pseudoephedrine-DM 30-2-10 MG/5ML syrup Take 10 mLs by mouth 4 (four) times daily as needed for up to 7 days. 02/27/22 03/06/22 Yes Shirlee Latch, PA-C  Calcium Carbonate-Vitamin D 600-400 MG-UNIT tablet Take 1 tablet by mouth daily.   Yes [provider]  carvedilol (COREG) 12.5 MG tablet Take 12.5 mg by mouth 2 (two) times daily with a meal.   Yes [provider]  diclofenac  (VOLTAREN) 50 MG EC tablet Take 50 mg by mouth 2 (two) times daily.   Yes [provider]  doxycycline (VIBRAMYCIN) 100 MG capsule Take 1 capsule (100 mg total) by mouth 2 (two) times daily for 7 days. 02/27/22 03/06/22 Yes Shirlee Latch, PA-C  hydrochlorothiazide (HYDRODIURIL) 25 MG tablet Take 25 mg by mouth daily.   Yes [provider]  ipratropium (ATROVENT) 0.06 % nasal spray Place 2 sprays into both nostrils 4 (four) times daily. 02/27/22  Yes Eusebio Friendly B, PA-C  IPRATROPIUM BROMIDE IN Inhale 17 mcg into the lungs.   Yes [provider]  levothyroxine (SYNTHROID, LEVOTHROID) 125 MCG tablet Take 125 mcg by mouth daily before breakfast.   Yes [provider]  losartan (COZAAR) 50 MG tablet Take 50 mg by mouth 2 (two) times daily.   Yes [provider]  potassium chloride (KLOR-CON) 20 MEQ packet Take 20 mEq by mouth daily.   Yes [provider]  potassium chloride SA (KLOR-CON) 20 MEQ tablet Take 1 tablet by mouth daily.   Yes [provider]  predniSONE (DELTASONE) 10 MG tablet Take 6 tabs on day 1 and decrease by 1 tablet daily until complete 02/27/22  Yes Eusebio Friendly B, PA-C  simvastatin (ZOCOR) 40 MG tablet Take 40 mg by mouth daily.   Yes [provider]  Spacer/Aero-Holding Deretha Emory (  AEROCHAMBER MV) inhaler Use as instructed 02/22/20   Margarette Canada, NP    Family History Family History  Problem Relation Age of Onset   Stroke Mother     Social History Social History   Tobacco Use   Smoking status: Former   Smokeless tobacco: Never  Scientific laboratory technician Use: Never used  Substance Use Topics   Alcohol use: No   Drug use: No     Allergies   Keflex [cephalexin], Penicillins, Codeine, and Nsaids   Review of Systems Review of Systems  Constitutional:  Positive for fatigue. Negative for chills, diaphoresis and fever.  HENT:  Positive for congestion, postnasal drip, rhinorrhea and sinus pressure. Negative  for ear pain, sinus pain and sore throat.   Respiratory:  Positive for cough. Negative for shortness of breath.   Cardiovascular:  Negative for chest pain.  Gastrointestinal:  Negative for abdominal pain, nausea and vomiting.  Musculoskeletal:  Negative for arthralgias and myalgias.  Skin:  Negative for rash.  Neurological:  Negative for weakness and headaches.  Hematological:  Negative for adenopathy.     Physical Exam Triage Vital Signs ED Triage Vitals  Enc Vitals Group     BP      Pulse      Resp      Temp      Temp src      SpO2      Weight      Height      Head Circumference      Peak Flow      Pain Score      Pain Loc      Pain Edu?      Excl. in [place]?    No data found.  Updated Vital Signs BP (!) 154/93 (BP Location: Right Arm)   Pulse 64   Temp 98 F (36.7 C) (Oral)   Resp 18   Ht 5\' 4"  (1.626 m)   Wt 229 lb 15 oz (104.3 kg)   SpO2 96%   BMI 39.47 kg/m       Physical Exam Vitals and nursing note reviewed.  Constitutional:      General: She is not in acute distress.    Appearance: Normal appearance. She is not ill-appearing or toxic-appearing.  HENT:     Head: Normocephalic and atraumatic.     Nose: Congestion present.     Mouth/Throat:     Mouth: Mucous membranes are moist.     Pharynx: Oropharynx is clear. Posterior oropharyngeal erythema (mild with post nasal drainage) present.  Eyes:     General: No scleral icterus.       Right eye: No discharge.        Left eye: No discharge.     Conjunctiva/sclera: Conjunctivae normal.  Cardiovascular:     Rate and Rhythm: Normal rate and regular rhythm.     Heart sounds: Normal heart sounds.  Pulmonary:     Effort: Pulmonary effort is normal. No respiratory distress.     Breath sounds: Normal breath sounds.  Musculoskeletal:     Cervical back: Neck supple.  Skin:    General: Skin is dry.  Neurological:     General: No focal deficit present.     Mental Status: She is alert. Mental status is at  baseline.     Motor: No weakness.     Gait: Gait normal.  Psychiatric:        Mood and Affect: Mood normal.  Behavior: Behavior normal.        Thought Content: Thought content normal.      UC Treatments / Results  Labs (all labs ordered are listed, but only abnormal results are displayed) Labs Reviewed - No data to display  EKG   Radiology DG Chest 2 View  Result Date: 02/27/2022 CLINICAL DATA:  Cough, congestion EXAM: CHEST - 2 VIEW COMPARISON:  02/22/2020 FINDINGS: Lungs are clear.  No pleural effusion or pneumothorax. The heart is normal in size. Mild degenerative changes of the visualized thoracolumbar spine. IMPRESSION: Normal chest radiographs. Electronically Signed   By: Julian Hy M.D.   On: 02/27/2022 19:18    Procedures Procedures (including critical care time)  Medications Ordered in UC Medications - No data to display  Initial Impression / Assessment and Plan / UC Course  I have reviewed the triage vital signs and the nursing notes.  Pertinent labs & imaging results that were available during my care of the patient were reviewed by me and considered in my medical decision making (see chart for details).   67 year old female with history of emphysema presents for 1 month history of nasal congestion, sinus pressure, postnasal drainage and cough.  Seen here 6 days ago and prescribed azithromycin, prednisone, Promethazine DM and benzonatate.  Reports no improvement and think she is a little worse than she was last week.  No fever and denies any shortness of breath.  She is afebrile and overall well-appearing.  No acute distress benign exam she does have nasal congestion without drainage.  Mild posterior pharyngeal erythema with postnasal drainage.  Chest clear to auscultation.  Chest x-ray obtained to assess for possible pneumonia.  X-ray without evidence of pneumonia.  Discussed result with patient.  Suspect acute sinusitis and flareup of underlying  COPD.  Sent doxycycline to pharmacy which might cover pathogens a little better than azithromycin.  Also sent Bromfed-DM instead of Promethazine DM and prescribed Atrovent nasal spray and another course of prednisone.  Encourage plenty of rest and fluids and use of inhalers if needed.  Advised if no improvement in the next week or worsening symptoms to see PCP.  For any increased shortness of breath or fever, advised patient to return.   Final Clinical Impressions(s) / UC Diagnoses   Final diagnoses:  Acute sinusitis, recurrence not specified, unspecified location  Acute cough  COPD exacerbation ([place])     Discharge Instructions      -X-ray does not show any pneumonia. - You have a sinus infection.  The doxycycline might be a little better than the previous antibiotic. - I sent cough medication as well that has a decongestant in it and a nasal spray.  I also sent more prednisone to the pharmacy.  Increase rest and fluids.  If no improvement over the next week with this treatment plan, follow-up with your PCP. - Return for any acute worsening symptoms including fever increased shortness of breath.     ED Prescriptions     Medication Sig Dispense Auth. Provider   doxycycline (VIBRAMYCIN) 100 MG capsule Take 1 capsule (100 mg total) by mouth 2 (two) times daily for 7 days. 14 capsule Laurene Footman B, PA-C   brompheniramine-pseudoephedrine-DM 30-2-10 MG/5ML syrup Take 10 mLs by mouth 4 (four) times daily as needed for up to 7 days. 150 mL Laurene Footman B, PA-C   ipratropium (ATROVENT) 0.06 % nasal spray Place 2 sprays into both nostrils 4 (four) times daily. 15 mL Danton Clap, PA-C  predniSONE (DELTASONE) 10 MG tablet Take 6 tabs on day 1 and decrease by 1 tablet daily until complete 21 tablet Danton Clap, PA-C      PDMP not reviewed this encounter.   Danton Clap, PA-C 02/27/22 1943

## 2022-02-27 NOTE — ED Triage Notes (Signed)
Pt c/o cough, nasal congestion. She states she was seen on 02/17/22 and took all her medication she was given but is not better. Denies fever.

## 2022-02-27 NOTE — Discharge Instructions (Addendum)
-  X-ray does not show any pneumonia. - You have a sinus infection.  The doxycycline might be a little better than the previous antibiotic. - I sent cough medication as well that has a decongestant in it and a nasal spray.  I also sent more prednisone to the pharmacy.  Increase rest and fluids.  If no improvement over the next week with this treatment plan, follow-up with your PCP. - Return for any acute worsening symptoms including fever increased shortness of breath.

## 2022-07-13 ENCOUNTER — Encounter: Payer: Self-pay | Admitting: Anesthesiology

## 2022-07-17 ENCOUNTER — Encounter: Payer: Self-pay | Admitting: Ophthalmology

## 2022-07-20 NOTE — Discharge Instructions (Signed)

## 2022-07-23 ENCOUNTER — Ambulatory Visit: Admission: RE | Admit: 2022-07-23 | Payer: 59 | Source: Ambulatory Visit | Admitting: Ophthalmology

## 2022-07-23 HISTORY — DX: Sleep apnea, unspecified: G47.30

## 2022-07-23 HISTORY — DX: Presence of dental prosthetic device (complete) (partial): Z97.2

## 2022-07-23 HISTORY — DX: Ventral hernia without obstruction or gangrene: K43.9

## 2022-07-23 SURGERY — PHACOEMULSIFICATION, CATARACT, WITH IOL INSERTION
Anesthesia: Topical | Laterality: Right

## 2022-08-06 ENCOUNTER — Ambulatory Visit: Admit: 2022-08-06 | Payer: 59 | Admitting: Ophthalmology

## 2022-08-06 SURGERY — PHACOEMULSIFICATION, CATARACT, WITH IOL INSERTION
Anesthesia: Topical | Laterality: Left

## 2022-11-06 ENCOUNTER — Ambulatory Visit: Payer: 59 | Admitting: Dermatology

## 2023-03-15 ENCOUNTER — Encounter: Payer: Self-pay | Admitting: Emergency Medicine

## 2023-03-15 ENCOUNTER — Ambulatory Visit
Admission: EM | Admit: 2023-03-15 | Discharge: 2023-03-15 | Disposition: A | Discharge status: Home / Self Care | Payer: No Typology Code available for payment source | Attending: Physician Assistant | Admitting: Physician Assistant

## 2023-03-15 DIAGNOSIS — R058 Other specified cough: Secondary | ICD-10-CM

## 2023-03-15 DIAGNOSIS — J019 Acute sinusitis, unspecified: Secondary | ICD-10-CM | POA: Diagnosis not present

## 2023-03-15 DIAGNOSIS — J441 Chronic obstructive pulmonary disease with (acute) exacerbation: Secondary | ICD-10-CM | POA: Diagnosis not present

## 2023-03-15 MED ORDER — DOXYCYCLINE HYCLATE 100 MG PO CAPS: 100.0000 mg | ORAL_CAPSULE | Freq: Two times a day (BID) | ORAL | 0 refills | Status: AC

## 2023-03-15 MED ORDER — PREDNISONE 20 MG PO TABS: 40.0000 mg | ORAL_TABLET | Freq: Every day | ORAL | 0 refills | Status: AC

## 2023-03-15 MED ORDER — BENZONATATE 200 MG PO CAPS: 200.0000 mg | ORAL_CAPSULE | Freq: Three times a day (TID) | ORAL | 0 refills | Status: DC | PRN

## 2023-03-15 NOTE — ED Provider Notes (Signed)
[facility]    CSN: [id number] Arrival date & time: 03/15/23  1831      History   Chief Complaint Chief Complaint  Patient presents with   Cough    HPI Ruth Griffin is a 68 y.o. female returning for 3 week history of fatigue, nasal congestion, sinus pressure, post nasal drainage, and productive cough.  The nasal drainage is yellowish discoloration and so is the sputum from the cough. Cough is worse when she lies down. Denies fever or any known exposure to COVID or flu.  Medical history significant for emphysema.  She denies feeling any more short of breath than normal.  Denies fever or any known exposure to COVID or flu.  Has been taking OTC meds without relief.  HPI  Past Medical History:  Diagnosis Date   Arthritis    COVID-19 03/2019   Emphysema lung (HCC)    Hernia of abdominal wall    Hypertension    Sleep apnea    (CPAP macchine is under recall)   Thyroid disease    Wears dentures    full upper    There are no active problems to display for this patient.   Past Surgical History:  Procedure Laterality Date   ABDOMINAL HYSTERECTOMY     REPLACEMENT TOTAL KNEE Bilateral    TONSILLECTOMY      OB History   No obstetric history on file.      Home Medications    Prior to Admission medications   Medication Sig Start Date End Date Taking? Authorizing Provider  benzonatate (TESSALON) 200 MG capsule Take 1 capsule (200 mg total) by mouth 3 (three) times daily as needed for cough. 03/15/23  Yes Eusebio Friendly B, PA-C  doxycycline (VIBRAMYCIN) 100 MG capsule Take 1 capsule (100 mg total) by mouth 2 (two) times daily for 7 days. 03/15/23 03/22/23 Yes Shirlee Latch, PA-C  predniSONE (DELTASONE) 20 MG tablet Take 2 tablets (40 mg total) by mouth daily for 5 days. 03/15/23 03/20/23 Yes Shirlee Latch, PA-C  albuterol (VENTOLIN HFA) 108 (90 Base) MCG/ACT inhaler Inhale 2 puffs into the lungs every 4 (four) hours as needed. 02/17/22   Valinda Hoar, NP   aspirin 81 MG chewable tablet Chew 81 mg by mouth daily.    [provider]  carvedilol (COREG) 12.5 MG tablet Take 12.5 mg by mouth 2 (two) times daily with a meal.    [provider]  diclofenac (VOLTAREN) 50 MG EC tablet Take 50 mg by mouth 2 (two) times daily. Patient not taking: Reported on 07/17/2022    [provider]  hydrochlorothiazide (HYDRODIURIL) 25 MG tablet Take 25 mg by mouth daily.    [provider]  ipratropium (ATROVENT) 0.06 % nasal spray Place 2 sprays into both nostrils 4 (four) times daily. 02/27/22   Eusebio Friendly B, PA-C  IPRATROPIUM BROMIDE IN Inhale 17 mcg into the lungs.    [provider]  levothyroxine (SYNTHROID, LEVOTHROID) 125 MCG tablet Take 75 mcg by mouth daily before breakfast.    [provider]  losartan (COZAAR) 50 MG tablet Take 50 mg by mouth 2 (two) times daily.    [provider]  potassium chloride (KLOR-CON) 20 MEQ packet Take 20 mEq by mouth daily.    [provider]  simvastatin (ZOCOR) 40 MG tablet Take 40 mg by mouth daily.    [provider]  Spacer/Aero-Holding Chambers (AEROCHAMBER MV) inhaler Use as instructed 02/22/20   Becky Augusta,  NP    Family History Family History  Problem Relation Age of Onset   Stroke Mother     Social History Social History   Tobacco Use   Smoking status: Former    Current packs/day: 0.00    Types: Cigarettes    Quit date: 2001    Years since quitting: 24.1   Smokeless tobacco: Never  Vaping Use   Vaping status: Never Used  Substance Use Topics   Alcohol use: No   Drug use: No     Allergies   Keflex [cephalexin], Penicillins, Codeine, Blue dyes (parenteral), Nsaids, and Oxycodone   Review of Systems Review of Systems  Constitutional:  Positive for fatigue. Negative for chills, diaphoresis and fever.  HENT:  Positive for congestion, postnasal drip, rhinorrhea and sinus pressure. Negative for ear pain, sinus pain  and sore throat.   Respiratory:  Positive for cough. Negative for shortness of breath.   Cardiovascular:  Negative for chest pain.  Gastrointestinal:  Negative for abdominal pain, nausea and vomiting.  Musculoskeletal:  Negative for arthralgias and myalgias.  Skin:  Negative for rash.  Neurological:  Negative for weakness and headaches.  Hematological:  Negative for adenopathy.     Physical Exam Triage Vital Signs ED Triage Vitals  Enc Vitals Group     BP      Pulse      Resp      Temp      Temp src      SpO2      Weight      Height      Head Circumference      Peak Flow      Pain Score      Pain Loc      Pain Edu?      Excl. in [place]?    No data found.  Updated Vital Signs BP (P) 138/85 (BP Location: Right Arm)   Pulse (P) 64   Temp (P) 97.7 F (36.5 C) (Oral)   Resp (P) 15   Ht 5\' 4"  (1.626 m)   Wt 238 lb 15.7 oz (108.4 kg)   SpO2 (P) 97%   BMI 41.02 kg/m       Physical Exam Vitals and nursing note reviewed.  Constitutional:      General: She is not in acute distress.    Appearance: Normal appearance. She is not ill-appearing or toxic-appearing.  HENT:     Head: Normocephalic and atraumatic.     Right Ear: Tympanic membrane, ear canal and external ear normal.     Left Ear: Tympanic membrane, ear canal and external ear normal.     Nose: Congestion present.     Mouth/Throat:     Mouth: Mucous membranes are moist.     Pharynx: Oropharynx is clear. Posterior oropharyngeal erythema (mild with post nasal drainage) present.  Eyes:     General: No scleral icterus.       Right eye: No discharge.        Left eye: No discharge.     Conjunctiva/sclera: Conjunctivae normal.  Cardiovascular:     Rate and Rhythm: Normal rate and regular rhythm.     Heart sounds: Normal heart sounds.  Pulmonary:     Effort: Pulmonary effort is normal. No respiratory distress.     Breath sounds: Rhonchi present.  Chest:     Chest wall: Tenderness: bilateral upper lung fields.   Musculoskeletal:     Cervical back: Neck supple.  Skin:  General: Skin is dry.  Neurological:     General: No focal deficit present.     Mental Status: She is alert. Mental status is at baseline.     Motor: No weakness.     Gait: Gait normal.  Psychiatric:        Mood and Affect: Mood normal.        Behavior: Behavior normal.      UC Treatments / Results  Labs (all labs ordered are listed, but only abnormal results are displayed) Labs Reviewed - No data to display  EKG   Radiology No results found.  Procedures Procedures (including critical care time)  Medications Ordered in UC Medications - No data to display  Initial Impression / Assessment and Plan / UC Course  I have reviewed the triage vital signs and the nursing notes.  Pertinent labs & imaging results that were available during my care of the patient were reviewed by me and considered in my medical decision making (see chart for details).   68 year old female with history of emphysema presents for 3 week history of nasal congestion, sinus pressure, postnasal drainage and cough. No fever and denies any shortness of breath.  She is afebrile and overall well-appearing.  No acute distress benign exam she does have nasal congestion without drainage.  Mild posterior pharyngeal erythema with postnasal drainage.  Few scattered rhonchi bilateral upper lung fields.  Suspect acute sinusitis and flareup of underlying COPD.  Sent doxycycline to pharmacy.  Also sent benzonatate and a course of prednisone.  Encourage plenty of rest and fluids and use of inhalers if needed.  Advised if no improvement in the next week or worsening symptoms to see PCP.  For any increased shortness of breath or fever, advised patient to return.   Final Clinical Impressions(s) / UC Diagnoses   Final diagnoses:  Acute sinusitis, recurrence not specified, unspecified location  Productive cough  COPD exacerbation (HCC)     Discharge  Instructions      -You have a sinus infection and COPD flareup. - I sent antibiotics, cough medicine and prednisone. - Continue with use of inhaler. - Return if you develop fever, worsening cough, increased breathing difficulty or weakness.      ED Prescriptions     Medication Sig Dispense Auth. Provider   doxycycline (VIBRAMYCIN) 100 MG capsule Take 1 capsule (100 mg total) by mouth 2 (two) times daily for 7 days. 14 capsule Eusebio Friendly B, PA-C   predniSONE (DELTASONE) 20 MG tablet Take 2 tablets (40 mg total) by mouth daily for 5 days. 10 tablet Eusebio Friendly B, PA-C   benzonatate (TESSALON) 200 MG capsule Take 1 capsule (200 mg total) by mouth 3 (three) times daily as needed for cough. 30 capsule Shirlee Latch, PA-C      PDMP not reviewed this encounter.     Shirlee Latch, PA-C 03/15/23 1939

## 2023-03-15 NOTE — ED Triage Notes (Signed)
Patient c/o cough and chest congestion for 3 weeks.  Patient states that when she lays down her cough is worse.  Patient reports some fatigue.

## 2023-03-15 NOTE — Discharge Instructions (Addendum)
-  You have a sinus infection and COPD flareup. - I sent antibiotics, cough medicine and prednisone. - Continue with use of inhaler. - Return if you develop fever, worsening cough, increased breathing difficulty or weakness.

## 2023-04-24 ENCOUNTER — Ambulatory Visit
Admission: EM | Admit: 2023-04-24 | Discharge: 2023-04-24 | Disposition: A | Discharge status: Home / Self Care | Attending: Emergency Medicine | Admitting: Emergency Medicine

## 2023-04-24 ENCOUNTER — Encounter: Payer: Self-pay | Admitting: Emergency Medicine

## 2023-04-24 DIAGNOSIS — M62838 Other muscle spasm: Secondary | ICD-10-CM | POA: Diagnosis not present

## 2023-04-24 MED ORDER — PREDNISONE 10 MG (21) PO TBPK: ORAL_TABLET | ORAL | 0 refills | Status: DC

## 2023-04-24 MED ORDER — BACLOFEN 10 MG PO TABS: 10.0000 mg | ORAL_TABLET | Freq: Three times a day (TID) | ORAL | 0 refills | Status: DC

## 2023-04-24 NOTE — ED Provider Notes (Signed)
 [facility]    CSN: [id number] Arrival date & time: 04/24/23  1810      History   Chief Complaint Chief Complaint  Patient presents with   Neck Pain    HPI Ruth Griffin is a 68 y.o. female.   HPI  68 year old female with past medical history significant for sleep apnea on CPAP, thyroid disease, hypertension, emphysema, and arthritis presents for evaluation of neck pain that has been going on for last 3 weeks.  She reports that it hurts to turn her head from left to right.  She can flex and extend her neck without difficulty.  She denies any falls, injuries, or car accidents.  She reports that before her symptoms began she had bronchitis and she had been coughing a lot.  Past Medical History:  Diagnosis Date   Arthritis    COVID-19 03/2019   Emphysema lung (HCC)    Hernia of abdominal wall    Hypertension    Sleep apnea    (CPAP macchine is under recall)   Thyroid disease    Wears dentures    full upper    There are no active problems to display for this patient.   Past Surgical History:  Procedure Laterality Date   ABDOMINAL HYSTERECTOMY     REPLACEMENT TOTAL KNEE Bilateral    TONSILLECTOMY      OB History   No obstetric history on file.      Home Medications    Prior to Admission medications   Medication Sig Start Date End Date Taking? Authorizing Provider  baclofen (LIORESAL) 10 MG tablet Take 1 tablet (10 mg total) by mouth 3 (three) times daily. 04/24/23  Yes Becky Augusta, NP  predniSONE (STERAPRED UNI-PAK 21 TAB) 10 MG (21) TBPK tablet Take 6 tablets on day 1, 5 tablets day 2, 4 tablets day 3, 3 tablets day 4, 2 tablets day 5, 1 tablet day 6 04/24/23  Yes Becky Augusta, NP  albuterol (VENTOLIN HFA) 108 (90 Base) MCG/ACT inhaler Inhale 2 puffs into the lungs every 4 (four) hours as needed. 02/17/22   Valinda Hoar, NP  aspirin 81 MG chewable tablet Chew 81 mg by mouth daily.    [provider]  benzonatate (TESSALON) 200 MG  capsule Take 1 capsule (200 mg total) by mouth 3 (three) times daily as needed for cough. 03/15/23   Shirlee Latch, PA-C  carvedilol (COREG) 12.5 MG tablet Take 12.5 mg by mouth 2 (two) times daily with a meal.    [provider]  diclofenac (VOLTAREN) 50 MG EC tablet Take 50 mg by mouth 2 (two) times daily. Patient not taking: Reported on 07/17/2022    [provider]  hydrochlorothiazide (HYDRODIURIL) 25 MG tablet Take 25 mg by mouth daily.    [provider]  ipratropium (ATROVENT) 0.06 % nasal spray Place 2 sprays into both nostrils 4 (four) times daily. 02/27/22   Eusebio Friendly B, PA-C  IPRATROPIUM BROMIDE IN Inhale 17 mcg into the lungs.    [provider]  levothyroxine (SYNTHROID, LEVOTHROID) 125 MCG tablet Take 75 mcg by mouth daily before breakfast.    [provider]  losartan (COZAAR) 50 MG tablet Take 50 mg by mouth 2 (two) times daily.    [provider]  potassium chloride (KLOR-CON) 20 MEQ packet Take 20 mEq by mouth daily.    [provider]  simvastatin (ZOCOR) 40 MG tablet Take 40 mg by mouth daily.    [provider]  Spacer/Aero-Holding Chambers (AEROCHAMBER MV) inhaler Use as instructed 02/22/20   Becky Augusta, NP    Family History Family History  Problem Relation Age of Onset   Stroke Mother     Social History Social History   Tobacco Use   Smoking status: Former    Current packs/day: 0.00    Types: Cigarettes    Quit date: 2001    Years since quitting: 24.2   Smokeless tobacco: Never  Vaping Use   Vaping status: Never Used  Substance Use Topics   Alcohol use: No   Drug use: No     Allergies   Keflex [cephalexin], Penicillins, Codeine, Blue dyes (parenteral), Clindamycin, Felodipine, Fexofenadine, Fluvastatin, Hydrocodone-acetaminophen, Ibuprofen, Lisinopril, Loratadine, Nifedipine, Pravastatin, Simvastatin, Sulindac, Tramadol, Amlodipine, Atenolol, Meloxicam, Nsaids, Oxycodone, and  Salsalate   Review of Systems Review of Systems  Musculoskeletal:  Positive for neck pain and neck stiffness.  Neurological:  Negative for weakness and numbness.     Physical Exam Triage Vital Signs ED Triage Vitals [04/24/23 1818]  Encounter Vitals Group     BP      Systolic BP Percentile      Diastolic BP Percentile      Pulse      Resp      Temp      Temp src      SpO2      Weight      Height      Head Circumference      Peak Flow      Pain Score 4     Pain Loc      Pain Education      Exclude from Growth Chart    No data found.  Updated Vital Signs BP 139/81 (BP Location: Left Arm)   Pulse 69   Temp 98.2 F (36.8 C) (Oral)   Resp 18   SpO2 99%   Visual Acuity Right Eye Distance:   Left Eye Distance:   Bilateral Distance:    Right Eye Near:   Left Eye Near:    Bilateral Near:     Physical Exam Vitals and nursing note reviewed.  Constitutional:      Appearance: Normal appearance.  Musculoskeletal:        General: No swelling, tenderness or signs of injury. Normal range of motion.  Skin:    General: Skin is warm and dry.     Capillary Refill: Capillary refill takes less than 2 seconds.     Findings: No bruising or erythema.  Neurological:     General: No focal deficit present.     Mental Status: She is alert and oriented to person, place, and time.      UC Treatments / Results  Labs (all labs ordered are listed, but only abnormal results are displayed) Labs Reviewed - No data to display  EKG   Radiology No results found.  Procedures Procedures (including critical care time)  Medications Ordered in UC Medications - No data to display  Initial Impression / Assessment and Plan / UC Course  I have reviewed the triage vital signs and the nursing notes.  Pertinent labs & imaging results that were available during my care of the patient were reviewed by me and considered in my medical decision making (see chart for details).   Patient  is a pleasant, nontoxic-appearing 68 year old female presenting for evaluation of neck pain as outlined in HPI above.  She has no midline spinous process tenderness or step-off in her  cervical or upper thoracic spine.  She has marked tension and spasm in the muscles in the bilateral paraspinous region and upper trapezius muscles bilaterally.  She denies any tenderness to palpation however.  I suspect that the muscle spasm in the bilateral trapezius muscles is what is causing her her pain in her neck.  She has taken Tylenol and used moist heat with limited improvement in her pain.  She reports that she cannot take ibuprofen.  I will discharge her home on a prednisone taper to start tomorrow and baclofen 10 mg every 8 hours that she can start tonight.  Also home physical therapy exercises.  I will encourage her to continue moist heat therapy to help improve blood flow to the muscle group and help alleviate muscle spasm.  Return precautions reviewed.   Final Clinical Impressions(s) / UC Diagnoses   Final diagnoses:  Muscle spasms of neck     Discharge Instructions      Take the prednisone according the package instructions.  Start this tomorrow morning and take it with breakfast.  Take the baclofen, 10 mg every 8 hours, on a schedule for the next 48 hours and then as needed.  Apply moist heat to your neck for 30 minutes at a time 2-3 times a day to improve blood flow to the area and help remove the lactic acid causing the spasm.  Follow the neck exercises given at discharge.  Return for reevaluation for any new or worsening symptoms.      ED Prescriptions     Medication Sig Dispense Auth. Provider   baclofen (LIORESAL) 10 MG tablet Take 1 tablet (10 mg total) by mouth 3 (three) times daily. 30 each Becky Augusta, NP   predniSONE (STERAPRED UNI-PAK 21 TAB) 10 MG (21) TBPK tablet Take 6 tablets on day 1, 5 tablets day 2, 4 tablets day 3, 3 tablets day 4, 2 tablets day 5, 1 tablet day 6 21  tablet Becky Augusta, NP      PDMP not reviewed this encounter.   Becky Augusta, NP 04/24/23 [phone]

## 2023-04-24 NOTE — Discharge Instructions (Addendum)
 Take the prednisone according the package instructions.  Start this tomorrow morning and take it with breakfast.  Take the baclofen, 10 mg every 8 hours, on a schedule for the next 48 hours and then as needed.  Apply moist heat to your neck for 30 minutes at a time 2-3 times a day to improve blood flow to the area and help remove the lactic acid causing the spasm.  Follow the neck exercises given at discharge.  Return for reevaluation for any new or worsening symptoms.

## 2023-04-24 NOTE — ED Triage Notes (Signed)
 Pt presents with neck pain x 3 weeks. Pt states hit hurts to turn her head left to right. She has tried OTC pain medication.

## 2023-05-20 ENCOUNTER — Ambulatory Visit (INDEPENDENT_AMBULATORY_CARE_PROVIDER_SITE_OTHER): Admitting: Student

## 2023-05-20 VITALS — BP 128/84 | HR 79 | Ht 64.0 in | Wt 264.0 lb

## 2023-05-20 DIAGNOSIS — Z96653 Presence of artificial knee joint, bilateral: Secondary | ICD-10-CM

## 2023-05-20 DIAGNOSIS — E66813 Obesity, class 3: Secondary | ICD-10-CM | POA: Insufficient documentation

## 2023-05-20 DIAGNOSIS — E782 Mixed hyperlipidemia: Secondary | ICD-10-CM | POA: Diagnosis not present

## 2023-05-20 DIAGNOSIS — I6522 Occlusion and stenosis of left carotid artery: Secondary | ICD-10-CM

## 2023-05-20 DIAGNOSIS — N3941 Urge incontinence: Secondary | ICD-10-CM

## 2023-05-20 DIAGNOSIS — R32 Unspecified urinary incontinence: Secondary | ICD-10-CM | POA: Insufficient documentation

## 2023-05-20 DIAGNOSIS — I10 Essential (primary) hypertension: Secondary | ICD-10-CM

## 2023-05-20 DIAGNOSIS — R7989 Other specified abnormal findings of blood chemistry: Secondary | ICD-10-CM | POA: Insufficient documentation

## 2023-05-20 DIAGNOSIS — E785 Hyperlipidemia, unspecified: Secondary | ICD-10-CM | POA: Insufficient documentation

## 2023-05-20 DIAGNOSIS — T7840XD Allergy, unspecified, subsequent encounter: Secondary | ICD-10-CM

## 2023-05-20 DIAGNOSIS — E039 Hypothyroidism, unspecified: Secondary | ICD-10-CM

## 2023-05-20 DIAGNOSIS — T7840XA Allergy, unspecified, initial encounter: Secondary | ICD-10-CM | POA: Insufficient documentation

## 2023-05-20 DIAGNOSIS — R0609 Other forms of dyspnea: Secondary | ICD-10-CM

## 2023-05-20 MED ORDER — LEVOTHYROXINE SODIUM 75 MCG PO TABS: 75.0000 ug | ORAL_TABLET | Freq: Every day | ORAL | Status: AC

## 2023-05-20 NOTE — Assessment & Plan Note (Signed)
 Reports history of this and taking B12 supplements over the counter. Check B12 today.

## 2023-05-20 NOTE — Assessment & Plan Note (Signed)
 Check TSH today. Currently taking levothyroxine  75 mcg daily.

## 2023-05-20 NOTE — Assessment & Plan Note (Signed)
 Weight gain execrated by 2 courses of prednisone . With 20 pound weight gain in the last 6 months. Also with untreated OSA. She is working to get CPAP later this week. Will work on increasing physical activity will use pedal machine daily. Working on more insight into diet, she will log food intake. A1c and TSH today. Follow up in 3 months, if having trouble weight loss may be a good candidate for tirzepatide given OSA as well.

## 2023-05-20 NOTE — Assessment & Plan Note (Signed)
 Stable and well controlled. Medications are losartan 50 mg daily, hydrochlorothiazide 25 mg daily, carvedilol 12.4 mg twice daily. History of hypokalemia currently on potassium supplementation, 20mEQ daily. Check CMP today.

## 2023-05-20 NOTE — Assessment & Plan Note (Signed)
 Currently on simvastatin 40 mg daily, history of statin intolerance. Lipid panel today.

## 2023-05-20 NOTE — Assessment & Plan Note (Signed)
 Follow up with GYN.

## 2023-05-20 NOTE — Assessment & Plan Note (Signed)
 Has been on vitamin D  50,000 international units weekly. 25 hydroxyvitamin D

## 2023-05-20 NOTE — Assessment & Plan Note (Signed)
 Sees [facility] and continues with PT. Ambulating well with cane.

## 2023-05-20 NOTE — Progress Notes (Signed)
 New Patient Office Visit  Subjective    Patient ID: Ruth Griffin, female    DOB: [birth year]  Age: 68 y.o. MRN: [id number]  CC:  Chief Complaint  Patient presents with   Establish Care    Patient presents today to establish care. She is doing well today but she did just have recent back episode.     HPI [name] 68 year old person living obesity, HLD, HTN, hypokalemia on K supplementation, hypothyroidism, GERD, vitamin D  deficiency, OA, urinary urge incontinence, OSA, and  left carotid artery occulusion  with presents to establish care   Weight gain Reports weight gain from 249lb before Thanksgiving to 264lbs today. Has has 2 courses of prednisone  this year, once for bronchitis and once for neck strain. She does chores around the house but otherwise is sedentary. Denies swelling or weakness.  OSA CPAP as not been working for the past several years. Has appointment later this week at sleep clinic. Does report daytime sleepiness especially after meals.    Outpatient Encounter Medications as of 05/20/2023  Medication Sig   albuterol  (VENTOLIN  HFA) 108 (90 Base) MCG/ACT inhaler Inhale 2 puffs into the lungs every 4 (four) hours as needed.   aspirin 81 MG chewable tablet Chew 81 mg by mouth daily.   carvedilol (COREG) 12.5 MG tablet Take 12.5 mg by mouth 2 (two) times daily with a meal.   hydrochlorothiazide (HYDRODIURIL) 25 MG tablet Take 25 mg by mouth daily.   losartan (COZAAR) 50 MG tablet Take 50 mg by mouth 2 (two) times daily.   potassium chloride (KLOR-CON) 20 MEQ packet Take 20 mEq by mouth daily.   simvastatin (ZOCOR) 40 MG tablet Take 40 mg by mouth daily.   Spacer/Aero-Holding Chambers (AEROCHAMBER MV) inhaler Use as instructed   [DISCONTINUED] baclofen  (LIORESAL ) 10 MG tablet Take 1 tablet (10 mg total) by mouth 3 (three) times daily.   [DISCONTINUED] levothyroxine  (SYNTHROID , LEVOTHROID) 125 MCG tablet Take 75 mcg by mouth daily before breakfast.   diclofenac  (VOLTAREN) 50 MG EC tablet Take 50 mg by mouth 2 (two) times daily. (Patient not taking: Reported on 07/17/2022)   IPRATROPIUM BROMIDE  IN Inhale 17 mcg into the lungs. (Patient not taking: Reported on 05/20/2023)   levothyroxine  (SYNTHROID ) 75 MCG tablet Take 1 tablet (75 mcg total) by mouth daily before breakfast.   [DISCONTINUED] benzonatate  (TESSALON ) 200 MG capsule Take 1 capsule (200 mg total) by mouth 3 (three) times daily as needed for cough.   [DISCONTINUED] ipratropium (ATROVENT ) 0.06 % nasal spray Place 2 sprays into both nostrils 4 (four) times daily.   [DISCONTINUED] predniSONE  (STERAPRED UNI-PAK 21 TAB) 10 MG (21) TBPK tablet Take 6 tablets on day 1, 5 tablets day 2, 4 tablets day 3, 3 tablets day 4, 2 tablets day 5, 1 tablet day 6   No facility-administered encounter medications on file as of 05/20/2023.    Past Medical History:  Diagnosis Date   Arthritis    COVID-19 03/2019   Emphysema lung (HCC)    Hernia of abdominal wall    Hypertension    Sleep apnea    (CPAP macchine is under recall)   Thyroid  disease    Wears dentures    full upper    Past Surgical History:  Procedure Laterality Date   ABDOMINAL HYSTERECTOMY     REPLACEMENT TOTAL KNEE Bilateral    TONSILLECTOMY      Family History  Problem Relation Age of Onset   Stroke Mother  Social History   Socioeconomic History   Marital status: Single    Spouse name: Not on file   Number of children: Not on file   Years of education: Not on file   Highest education level: GED or equivalent  Occupational History   Not on file  Tobacco Use   Smoking status: Former    Current packs/day: 0.00    Types: Cigarettes    Quit date: 2001    Years since quitting: 24.3   Smokeless tobacco: Never  Vaping Use   Vaping status: Never Used  Substance and Sexual Activity   Alcohol use: No   Drug use: No   Sexual activity: Not on file  Other Topics Concern   Not on file  Social History Narrative   Not on file    Social Drivers of Health   Financial Resource Strain: Medium Risk (05/20/2023)   Overall Financial Resource Strain (CARDIA)    Difficulty of Paying Living Expenses: Somewhat hard  Food Insecurity: No Food Insecurity (05/20/2023)   Hunger Vital Sign    Worried About Running Out of Food in the Last Year: Never true    Ran Out of Food in the Last Year: Never true  Transportation Needs: No Transportation Needs (05/20/2023)   PRAPARE - Administrator, Civil Service (Medical): No    Lack of Transportation (Non-Medical): No  Physical Activity: Unknown (05/20/2023)   Exercise Vital Sign    Days of Exercise per Week: 0 days    Minutes of Exercise per Session: Not on file  Stress: Stress Concern Present (05/20/2023)   [employer] of Occupational Health - Occupational Stress Questionnaire    Feeling of Stress : To some extent  Social Connections: Unknown (05/20/2023)   Social Connection and Isolation Panel [NHANES]    Frequency of Communication with Friends and Family: More than three times a week    Frequency of Social Gatherings with Friends and Family: Three times a week    Attends Religious Services: Not on file    Active Member of Clubs or Organizations: No    Attends Banker Meetings: Not on file    Marital Status: Widowed  Intimate Partner Violence: Not on file    ROS Refer to HPI    Objective   BP 128/84   Pulse 79   Ht 5\' 4"  (1.626 m)   Wt 264 lb (119.7 kg)   SpO2 98%   BMI 45.32 kg/m   Physical Exam Constitutional:      Appearance: She is obese.     Comments: Ambulates well with cane  HENT:     Mouth/Throat:     Mouth: Mucous membranes are moist.     Pharynx: Oropharynx is clear.  Cardiovascular:     Rate and Rhythm: Normal rate and regular rhythm.     Pulses: Normal pulses.     Heart sounds: No murmur heard. Pulmonary:     Effort: Pulmonary effort is normal.     Breath sounds: No rhonchi or rales.  Abdominal:     General: Abdomen  is flat. Bowel sounds are normal. There is no distension.     Palpations: Abdomen is soft.     Tenderness: There is no abdominal tenderness.  Musculoskeletal:        General: Normal range of motion.     Right lower leg: No edema.     Left lower leg: No edema.  Skin:    General: Skin is warm and  dry.     Capillary Refill: Capillary refill takes less than 2 seconds.  Neurological:     General: No focal deficit present.     Mental Status: She is alert and oriented to person, place, and time.  Psychiatric:        Mood and Affect: Mood normal.        Behavior: Behavior normal.        05/20/2023    9:19 AM  Depression screen PHQ 2/9  Decreased Interest 1  Down, Depressed, Hopeless 1  PHQ - 2 Score 2  Altered sleeping 2  Tired, decreased energy 3  Change in appetite 0  Feeling bad or failure about yourself  1  Trouble concentrating 1  Moving slowly or fidgety/restless 0  Suicidal thoughts 0  PHQ-9 Score 9  Difficult doing work/chores Somewhat difficult      05/20/2023    9:20 AM  GAD 7 : Generalized Anxiety Score  Nervous, Anxious, on Edge 0  Control/stop worrying 0  Worry too much - different things 1  Trouble relaxing 1  Restless 0  Easily annoyed or irritable 1  Afraid - awful might happen 1  Total GAD 7 Score 4  Anxiety Difficulty Somewhat difficult    Assessment & Plan:  Obesity, Class III, BMI 40-49.9 (morbid obesity) (HCC) Assessment & Plan: Weight gain execrated by 2 courses of prednisone . With 20 pound weight gain in the last 6 months. Also with untreated OSA. She is working to get CPAP later this week. Will work on increasing physical activity will use pedal machine daily. Working on more insight into diet, she will log food intake. A1c and TSH today. Follow up in 3 months, if having trouble weight loss may be a good candidate for tirzepatide given OSA as well.   Orders: -     Hemoglobin A1c  Mixed hyperlipidemia Assessment & Plan: Currently on  simvastatin 40 mg daily, history of statin intolerance. Lipid panel today.   Orders: -     Lipid panel  Primary hypertension Assessment & Plan: Stable and well controlled. Medications are losartan 50 mg daily, hydrochlorothiazide 25 mg daily, carvedilol 12.4 mg twice daily. History of hypokalemia currently on potassium supplementation, 20mEQ daily. Check CMP today.   Orders: -     Comprehensive metabolic panel with GFR  Hypothyroidism, unspecified type Assessment & Plan: Check TSH today. Currently taking levothyroxine  75 mcg daily.   Orders: -     TSH  DOE (dyspnea on exertion) Assessment & Plan: Workup with PFT and echo at [facility] have been negative. Taking atrovent  as needed. No wheezing on exam today and saturating well. She does have upcoming visit to get new CPAP machine which may improve symptoms.   Orders: -     Hemoglobin A1c  Left carotid artery occlusion Assessment & Plan: Appear most recent study in 2020 with no evidence of hemodynamically significant stenosis or aneurysmal dilatation involving the bilateral carotid or vertebral arteries. Currently on ASA and stain. [facility] if she is due to [facility].   Orders: -     CBC with Differential/Platelet  Low vitamin D  level Assessment & Plan: Has been on vitamin D  50,000 international units weekly. 25 hydroxyvitamin D   Orders: -     VITAMIN D  25 Hydroxy (Vit-D Deficiency, Fractures)  Low vitamin B12 level Assessment & Plan: Reports history of this and taking B12 supplements over the counter. Check B12 today.  Orders: -  Vitamin B12  Total knee replacement status, bilateral Assessment & Plan: Sees [facility] and continues with PT. Ambulating well with cane.    Urge incontinence of urine Assessment & Plan: Follow up with GYN.    Allergy, subsequent encounter Assessment & Plan: Continue loratadine as needed   Other orders -     Levothyroxine  Sodium; Take 1 tablet (75 mcg  total) by mouth daily before breakfast.    Return in about 3 months (around 08/19/2023).   Barnetta Liberty, MD

## 2023-05-20 NOTE — Assessment & Plan Note (Signed)
 Appear most recent study in 2020 with no evidence of hemodynamically significant stenosis or aneurysmal dilatation involving the bilateral carotid or vertebral arteries. Currently on ASA and stain. [facility] with [facility] if she is due to [facility].

## 2023-05-20 NOTE — Assessment & Plan Note (Addendum)
 Workup with PFT and echo at [facility] have been negative. Taking atrovent  as needed. No wheezing on exam today and saturating well. She does have upcoming visit to get new CPAP machine which may improve symptoms.

## 2023-05-20 NOTE — Assessment & Plan Note (Signed)
Continue loratadine as needed 

## 2023-05-21 ENCOUNTER — Encounter: Payer: Self-pay | Admitting: Student

## 2023-05-21 LAB — CBC WITH DIFFERENTIAL/PLATELET
Basophils Absolute: 0.1 10*3/uL (ref 0.0–0.2)
Basos: 1 %
EOS (ABSOLUTE): 0.4 10*3/uL (ref 0.0–0.4)
Eos: 7 %
Hematocrit: 41.2 % (ref 34.0–46.6)
Hemoglobin: 13.2 g/dL (ref 11.1–15.9)
Immature Grans (Abs): 0 10*3/uL (ref 0.0–0.1)
Immature Granulocytes: 0 %
Lymphocytes Absolute: 1.6 10*3/uL (ref 0.7–3.1)
Lymphs: 29 %
MCH: 29.5 pg (ref 26.6–33.0)
MCHC: 32 g/dL (ref 31.5–35.7)
MCV: 92 fL (ref 79–97)
Monocytes Absolute: 0.6 10*3/uL (ref 0.1–0.9)
Monocytes: 10 %
Neutrophils Absolute: 2.9 10*3/uL (ref 1.4–7.0)
Neutrophils: 53 %
Platelets: 268 10*3/uL (ref 150–450)
RBC: 4.48 x10E6/uL (ref 3.77–5.28)
RDW: 14.3 % (ref 11.7–15.4)
WBC: 5.5 10*3/uL (ref 3.4–10.8)

## 2023-05-21 LAB — COMPREHENSIVE METABOLIC PANEL WITH GFR
ALT: 14 IU/L (ref 0–32)
AST: 17 IU/L (ref 0–40)
Albumin: 4.1 g/dL (ref 3.9–4.9)
Alkaline Phosphatase: 91 IU/L (ref 44–121)
BUN/Creatinine Ratio: 13 (ref 12–28)
BUN: 10 mg/dL (ref 8–27)
Bilirubin Total: 0.4 mg/dL (ref 0.0–1.2)
CO2: 25 mmol/L (ref 20–29)
Calcium: 9.5 mg/dL (ref 8.7–10.3)
Chloride: 106 mmol/L (ref 96–106)
Creatinine, Ser: 0.8 mg/dL (ref 0.57–1.00)
Globulin, Total: 2.5 g/dL (ref 1.5–4.5)
Glucose: 81 mg/dL (ref 70–99)
Potassium: 4.2 mmol/L (ref 3.5–5.2)
Sodium: 143 mmol/L (ref 134–144)
Total Protein: 6.6 g/dL (ref 6.0–8.5)
eGFR: 80 mL/min/{1.73_m2} (ref >59)

## 2023-05-21 LAB — VITAMIN B12: Vitamin B-12: 1025 pg/mL (ref 232–1245)

## 2023-05-21 LAB — TSH: TSH: 3.41 u[IU]/mL (ref 0.450–4.500)

## 2023-05-21 LAB — VITAMIN D 25 HYDROXY (VIT D DEFICIENCY, FRACTURES): Vit D, 25-Hydroxy: 51.2 ng/mL (ref 30.0–100.0)

## 2023-08-09 ENCOUNTER — Ambulatory Visit: Admitting: Student

## 2023-08-13 ENCOUNTER — Other Ambulatory Visit: Payer: Self-pay | Admitting: Student

## 2023-08-13 ENCOUNTER — Ambulatory Visit (INDEPENDENT_AMBULATORY_CARE_PROVIDER_SITE_OTHER): Admitting: Student

## 2023-08-13 ENCOUNTER — Encounter: Payer: Self-pay | Admitting: Student

## 2023-08-13 VITALS — BP 126/82 | HR 74 | Ht 64.0 in | Wt 265.0 lb

## 2023-08-13 DIAGNOSIS — M5432 Sciatica, left side: Secondary | ICD-10-CM

## 2023-08-13 DIAGNOSIS — M5416 Radiculopathy, lumbar region: Secondary | ICD-10-CM

## 2023-08-13 DIAGNOSIS — M1612 Unilateral primary osteoarthritis, left hip: Secondary | ICD-10-CM

## 2023-08-13 NOTE — Progress Notes (Unsigned)
 Established Patient Office Visit  Subjective   Patient ID: Ruth Griffin, female    DOB: 1955-03-12  Age: 68 y.o. MRN: [id number]  Chief Complaint  Patient presents with   Referral    [facility] for [facility] pain, requesting open MRI, Physical therapy referral also   Back Pain    Lower back, 8/10 pain, on and off since 2017   Lower back pain She has chronic back pain but pain has been worse in the last 2-3 weeks, in the left lower back pain. Pain radiates down the left leg to the foot with some pain in the left groin.  Went to [place] last week and had xray and noted to have anterolisthesis of L4-L5 and L5-S1 with possible L5 pars defect and recommended MRI. She is anxious about being in an MRI and requesting if open MRI is available. She denies fever, chills, recent trauma, midlines tenderness, loss of bowel control, or saddle numbness.    Patient Active Problem List   Diagnosis Date Noted   OA (osteoarthritis) of hip 08/14/2023   Left sided sciatica 08/14/2023   Left carotid artery occlusion 05/20/2023   Hyperlipidemia 05/20/2023   Hypertension 05/20/2023   Hypothyroid 05/20/2023   Allergies 05/20/2023   Urinary incontinence 05/20/2023   Obesity, Class III, BMI 40-49.9 (morbid obesity) 05/20/2023   Total knee replacement status, bilateral 05/20/2023   Low vitamin D  level 05/20/2023   Low vitamin B12 level 05/20/2023   DOE (dyspnea on exertion) 05/20/2023      ROS Refer to HPI    Objective:     BP 126/82   Pulse 74   Ht 5' 4 (1.626 m)   Wt 265 lb (120.2 kg)   SpO2 100%   BMI 45.49 kg/m  BP Readings from Last 3 Encounters:  08/13/23 126/82  05/20/23 128/84  04/24/23 139/81    Physical Exam Constitutional:      Appearance: Normal appearance.  Cardiovascular:     Rate and Rhythm: Normal rate and regular rhythm.  Pulmonary:     Effort: Pulmonary effort is normal.     Breath sounds: No rhonchi or rales.  Abdominal:     General:  Abdomen is flat. Bowel sounds are normal. There is no distension.     Palpations: Abdomen is soft.     Tenderness: There is no abdominal tenderness.  Musculoskeletal:        General: Normal range of motion.     Right lower leg: No edema.     Left lower leg: No edema.     Comments: TTP of the L lower back, limited ROM on extension of the lower back, no midline tenderness  Skin:    General: Skin is warm and dry.     Capillary Refill: Capillary refill takes less than 2 seconds.  Neurological:     General: No focal deficit present.     Mental Status: She is alert and oriented to person, place, and time.  Psychiatric:        Mood and Affect: Mood normal.        Behavior: Behavior normal.        08/13/2023    4:08 PM 05/20/2023    9:19 AM  Depression screen PHQ 2/9  Decreased Interest 0 1  Down, Depressed, Hopeless 0 1  PHQ - 2 Score 0 2  Altered sleeping 0 2  Tired, decreased energy 0 3  Change in appetite 0 0  Feeling bad or failure about yourself  0 1  Trouble concentrating 0 1  Moving slowly or fidgety/restless 0 0  Suicidal thoughts 0 0  PHQ-9 Score 0 9  Difficult doing work/chores Not difficult at all Somewhat difficult       08/13/2023    4:08 PM 05/20/2023    9:20 AM  GAD 7 : Generalized Anxiety Score  Nervous, Anxious, on Edge 0 0  Control/stop worrying 0 0  Worry too much - different things 0 1  Trouble relaxing 0 1  Restless 0 0  Easily annoyed or irritable 0 1  Afraid - awful might happen 0 1  Total GAD 7 Score 0 4  Anxiety Difficulty Not difficult at all Somewhat difficult    No results found for any visits on 08/13/23.  Last CBC Lab Results  Component Value Date   WBC 5.5 05/20/2023   HGB 13.2 05/20/2023   HCT 41.2 05/20/2023   MCV 92 05/20/2023   MCH 29.5 05/20/2023   RDW 14.3 05/20/2023   PLT 268 05/20/2023   Last metabolic panel Lab Results  Component Value Date   GLUCOSE 81 05/20/2023   NA 143 05/20/2023   K 4.2 05/20/2023   CL 106  05/20/2023   CO2 25 05/20/2023   BUN 10 05/20/2023   CREATININE 0.80 05/20/2023   EGFR 80 05/20/2023   CALCIUM 9.5 05/20/2023   PROT 6.6 05/20/2023   ALBUMIN 4.1 05/20/2023   LABGLOB 2.5 05/20/2023   BILITOT 0.4 05/20/2023   ALKPHOS 91 05/20/2023   AST 17 05/20/2023   ALT 14 05/20/2023   ANIONGAP 3 (L) 04/09/2013      The 10-year ASCVD risk score (Arnett DK, et al., 2019) is: 7.8%    Assessment & Plan:  Left sided sciatica Assessment & Plan: Evaluated by PCP at [facility] on 7/7, Xray with anterolisthesis of L4-L5 and L5-S1 and severe lower lumbar facet arthropathy and DDD of L3-S1. Possible L5 pars defect, MRI to evaluate this. She request and open MRI. Tylenol and diclofenac are helping with pain, PT has been helpful in the past, referral made to PT.   Orders: -     Ambulatory referral to Physical Therapy -     MR LUMBAR SPINE WO CONTRAST; Future  Primary osteoarthritis of left hip Assessment & Plan: Mild L hip OA noted on recent lumbar xray on 7/7. Tylenol and diclofenac as needed. Referral made to PT. [facility] if not [facility].   Orders: -     Ambulatory referral to Physical Therapy     Return if symptoms worsen or fail to improve.    Harlene Saddler, MD

## 2023-08-14 DIAGNOSIS — M5432 Sciatica, left side: Secondary | ICD-10-CM | POA: Insufficient documentation

## 2023-08-14 DIAGNOSIS — M169 Osteoarthritis of hip, unspecified: Secondary | ICD-10-CM | POA: Insufficient documentation

## 2023-08-14 NOTE — Assessment & Plan Note (Signed)
 Mild L hip OA noted on recent lumbar xray on 7/7. Tylenol and diclofenac as needed. Referral made to PT. She will [facility] with [facility] if not improving.

## 2023-08-14 NOTE — Assessment & Plan Note (Signed)
 Evaluated by PCP at [facility] on 7/7, Xray with anterolisthesis of L4-L5 and L5-S1 and severe lower lumbar facet arthropathy and DDD of L3-S1. Possible L5 pars defect, MRI to evaluate this. She request and open MRI. Tylenol and diclofenac are helping with pain, PT has been helpful in the past, referral made to PT.

## 2023-08-15 NOTE — Telephone Encounter (Signed)
 Requested medications are due for refill today.  unsure  Requested medications are on the active medications list.  Yes - pt also requesting an inhaler  Last refill. unsure  Future visit scheduled.   yes  Notes to clinic.  Medication is historical.  - also requesting an inhaler.    Requested Prescriptions  Pending Prescriptions Disp Refills   simvastatin (ZOCOR) 40 MG tablet [Pharmacy Med Name: SIMVASTATIN 40 MG TABLET] 90 tablet 1    Sig: TAKE 1 TABLET BY MOUTH EVERYDAY AT BEDTIME     Cardiovascular:  Antilipid - Statins Failed - 08/15/2023 11:20 AM      Failed - Lipid Panel in normal range within the last 12 months    No results found for: CHOL, POCCHOL, CHOLTOT No results found for: LDLCALC, LDLC, HIRISKLDL, POCLDL, LDLDIRECT, REALLDLC, TOTLDLC No results found for: HDL, POCHDL No results found for: TRIG, POCTRIG       Passed - Patient is not pregnant      Passed - Valid encounter within last 12 months    Recent Outpatient Visits           2 days ago Left sided sciatica   [place] [facility] at [facility], Harlene, MD   2 months ago Obesity, Class III, BMI 40-49.9 (morbid obesity)   [place] [facility] at [facility], MD       Future Appointments             In 4 days Lemon Harlene, MD [facility] at [facility], [facility]

## 2023-08-15 NOTE — Telephone Encounter (Signed)
Please review medication refill  request

## 2023-08-16 ENCOUNTER — Other Ambulatory Visit: Payer: Self-pay

## 2023-08-16 MED ORDER — ATROVENT HFA 17 MCG/ACT IN AERS: 2.0000 | INHALATION_SPRAY | RESPIRATORY_TRACT | 2 refills | Status: AC | PRN

## 2023-08-16 NOTE — Telephone Encounter (Signed)
 Patient has been made aware.

## 2023-08-16 NOTE — Telephone Encounter (Signed)
 Called patient to reschedule her up coming appointment on 08/19/23. Informed her that simvastatin medication was refilled and while on the phone with her she requested a refill on her ATROVENT  inhaler.

## 2023-08-19 ENCOUNTER — Ambulatory Visit: Admitting: Student

## 2023-10-31 ENCOUNTER — Other Ambulatory Visit: Payer: Self-pay

## 2023-10-31 MED ORDER — POTASSIUM CHLORIDE 20 MEQ PO PACK: 20.0000 meq | PACK | Freq: Every day | ORAL | 0 refills | Status: DC

## 2023-12-30 ENCOUNTER — Encounter: Payer: Self-pay | Admitting: Student

## 2023-12-30 MED ORDER — POTASSIUM CHLORIDE CRYS ER 20 MEQ PO TBCR: 20.0000 meq | EXTENDED_RELEASE_TABLET | Freq: Two times a day (BID) | ORAL | 1 refills | Status: AC

## 2023-12-30 NOTE — Telephone Encounter (Signed)
 Please review

## 2024-02-15 ENCOUNTER — Other Ambulatory Visit: Payer: Self-pay | Admitting: Student

## 2024-02-17 NOTE — Telephone Encounter (Signed)
 Please review medication refill request

## 2024-02-17 NOTE — Telephone Encounter (Signed)
 Requested medication (s) are due for refill today - yes  Requested medication (s) are on the active medication list -yes  Future visit scheduled -no  Last refill: 08/15/23 #90 1RF  Notes to clinic: fails lab protocol- no labs listed  Requested Prescriptions  Pending Prescriptions Disp Refills   simvastatin (ZOCOR) 40 MG tablet [Pharmacy Med Name: SIMVASTATIN 40 MG TABLET] 90 tablet 1    Sig: TAKE 1 TABLET BY MOUTH EVERYDAY AT BEDTIME     Cardiovascular:  Antilipid - Statins Failed - 02/17/2024 11:53 AM      Failed - Lipid Panel in normal range within the last 12 months    No results found for: CHOL, POCCHOL, CHOLTOT No results found for: LDLCALC, LDLC, HIRISKLDL, POCLDL, LDLDIRECT, REALLDLC, TOTLDLC No results found for: HDL, POCHDL No results found for: TRIG, POCTRIG       Passed - Patient is not pregnant      Passed - Valid encounter within last 12 months    Recent Outpatient Visits           6 months ago Left sided sciatica   [place] [facility] at [facility], Harlene, MD   9 months ago Obesity, Class III, BMI 40-49.9 (morbid obesity)   [place] [facility] at [facility], Harlene, MD                 Requested Prescriptions  Pending Prescriptions Disp Refills   simvastatin (ZOCOR) 40 MG tablet [Pharmacy Med Name: SIMVASTATIN 40 MG TABLET] 90 tablet 1    Sig: TAKE 1 TABLET BY MOUTH EVERYDAY AT BEDTIME     Cardiovascular:  Antilipid - Statins Failed - 02/17/2024 11:53 AM      Failed - Lipid Panel in normal range within the last 12 months    No results found for: CHOL, POCCHOL, CHOLTOT No results found for: LDLCALC, LDLC, HIRISKLDL, POCLDL, LDLDIRECT, REALLDLC, TOTLDLC No results found for: HDL, POCHDL No results found for: TRIG, POCTRIG       Passed - Patient is not pregnant      Passed - Valid encounter within last 12 months    Recent  Outpatient Visits           6 months ago Left sided sciatica   [place] [facility] at [facility], Harlene, MD   9 months ago Obesity, Class III, BMI 40-49.9 (morbid obesity)   [place] [facility] at [facility], MD
